# Patient Record
Sex: Male | Born: 1990 | Race: White | Hispanic: No | Marital: Single | Smoking: Current every day smoker
Health system: Southern US, Community
[De-identification: ages and names within clinical notes are randomized; demographics above are authoritative.]

## PROBLEM LIST (undated history)

## (undated) ENCOUNTER — Emergency Department (HOSPITAL_COMMUNITY): Disposition: A | Discharge status: Home / Self Care | Payer: Self-pay

## (undated) DIAGNOSIS — F319 Bipolar disorder, unspecified: Secondary | ICD-10-CM

## (undated) DIAGNOSIS — F419 Anxiety disorder, unspecified: Secondary | ICD-10-CM

---

## 2012-11-16 ENCOUNTER — Emergency Department (HOSPITAL_COMMUNITY)
Admission: EM | Admit: 2012-11-16 | Discharge: 2012-11-16 | Discharge status: Against Medical Advice | Payer: Self-pay | Attending: Emergency Medicine | Admitting: Emergency Medicine

## 2012-11-16 ENCOUNTER — Emergency Department (HOSPITAL_COMMUNITY): Payer: Self-pay

## 2012-11-16 DIAGNOSIS — S4980XA Other specified injuries of shoulder and upper arm, unspecified arm, initial encounter: Secondary | ICD-10-CM | POA: Insufficient documentation

## 2012-11-16 DIAGNOSIS — R209 Unspecified disturbances of skin sensation: Secondary | ICD-10-CM | POA: Insufficient documentation

## 2012-11-16 DIAGNOSIS — S46909A Unspecified injury of unspecified muscle, fascia and tendon at shoulder and upper arm level, unspecified arm, initial encounter: Secondary | ICD-10-CM | POA: Insufficient documentation

## 2012-11-16 DIAGNOSIS — Y9389 Activity, other specified: Secondary | ICD-10-CM | POA: Insufficient documentation

## 2012-11-16 DIAGNOSIS — S0993XA Unspecified injury of face, initial encounter: Secondary | ICD-10-CM | POA: Insufficient documentation

## 2012-11-16 DIAGNOSIS — Y9289 Other specified places as the place of occurrence of the external cause: Secondary | ICD-10-CM | POA: Insufficient documentation

## 2012-11-16 DIAGNOSIS — W1809XA Striking against other object with subsequent fall, initial encounter: Secondary | ICD-10-CM | POA: Insufficient documentation

## 2012-11-16 DIAGNOSIS — W19XXXA Unspecified fall, initial encounter: Secondary | ICD-10-CM

## 2012-11-16 DIAGNOSIS — S060X1A Concussion with loss of consciousness of 30 minutes or less, initial encounter: Secondary | ICD-10-CM | POA: Insufficient documentation

## 2012-11-16 DIAGNOSIS — F411 Generalized anxiety disorder: Secondary | ICD-10-CM | POA: Insufficient documentation

## 2012-11-16 DIAGNOSIS — W010XXA Fall on same level from slipping, tripping and stumbling without subsequent striking against object, initial encounter: Secondary | ICD-10-CM | POA: Insufficient documentation

## 2012-11-16 NOTE — ED Notes (Addendum)
Called for triage x2 no answer

## 2012-11-16 NOTE — ED Notes (Signed)
Per EMS: Pt fell from steps of camper, striking back of head. Lost conscious for 5 sec. Pt reporting head,back, neck, right upper knee and arm pain. Moves all extremities. Soft tissue mass noted to right upper arm. PT AO x4. Neuro intact. 108/78, 54 SR, 100 RA. 4 mg Zofran given en route. NKA.

## 2012-11-16 NOTE — ED Provider Notes (Signed)
History     CSN: [id number]  Arrival date & time 11/16/12  9528   First MD Initiated Contact with Patient 11/16/12 1918      Chief Complaint  Patient presents with  . Fall    (Consider location/radiation/quality/duration/timing/severity/associated sxs/prior treatment) HPI Comments: 22 year old male presents emergency department complaining of neck pain, head pain, and right arm and upper leg pain after falling out of his camper prior to arrival. States he was standing in the doorway when the rug slipped from underneath him and he fell backwards causing him to hit the back of his head. Admits to loss of consciousness for about 3 seconds. Denies any confusion and remembers everything that happened. Describes his pain everywhere as 10 out of 10. He states he has a mass in the upper part of his arm. Admits to tingling in his right fingers. States he is very anxious at this time.  Patient is a 22 y.o. male presenting with fall. The history is provided by the patient.  Fall Associated symptoms include headaches. Pertinent negatives include no abdominal pain, no nausea and no vomiting.    No past medical history on file.  No past surgical history on file.  No family history on file.  History  Substance Use Topics  . Smoking status: Not on file  . Smokeless tobacco: Not on file  . Alcohol Use: Not on file      Review of Systems  HENT: Positive for neck pain.   Eyes: Negative for visual disturbance.  Respiratory: Negative for shortness of breath.   Cardiovascular: Negative for chest pain.  Gastrointestinal: Negative for nausea, vomiting and abdominal pain.  Musculoskeletal: Positive for arthralgias (right shoulder and leg pain). Negative for back pain.  Neurological: Positive for headaches. Negative for dizziness and speech difficulty.  Psychiatric/Behavioral: Negative for confusion.  All other systems reviewed and are negative.    Allergies  Review of patient's allergies  indicates not on file.  Home Medications  No current outpatient prescriptions on file.  BP 118/54  Pulse 56  Temp(Src) 99 F (37.2 C) (Oral)  Resp 11  SpO2 100%  Physical Exam  Nursing note and vitals reviewed. Constitutional: He is oriented to person, place, and time. He appears well-developed and well-nourished. No distress. Cervical collar in place.  HENT:  Head: Normocephalic. Head is without raccoon's eyes, without Battle's sign, without abrasion, without contusion and without laceration.    Right Ear: No hemotympanum.  Left Ear: No hemotympanum.  Mouth/Throat: Uvula is midline, oropharynx is clear and moist and mucous membranes are normal.  Eyes: Conjunctivae and EOM are normal. Pupils are equal, round, and reactive to light.  Neck: Spinous process tenderness and muscular tenderness present.  Cardiovascular: Normal rate, regular rhythm, normal heart sounds and intact distal pulses.   Pulmonary/Chest: Effort normal and breath sounds normal. No respiratory distress.  Abdominal: Soft. Bowel sounds are normal. There is no tenderness.  Musculoskeletal:       Right shoulder: He exhibits decreased range of motion (screams when trying to perform passive ROM) and tenderness (generalized). He exhibits no swelling, no deformity and normal pulse.       Right elbow: Normal.      Right wrist: Normal.       Right hip: He exhibits normal range of motion and no laceration.       Thoracic back: Normal.       Lumbar back: Normal.       Arms:      Legs:  Neurological: He is alert and oriented to person, place, and time. No cranial nerve deficit or sensory deficit. GCS eye subscore is 4. GCS verbal subscore is 5. GCS motor subscore is 6.  Skin: Skin is warm, dry and intact. No bruising and no ecchymosis noted.  Psychiatric: His speech is normal. His mood appears anxious. He is agitated and slowed. Cognition and memory are normal. He expresses impulsivity.    ED Course  Procedures (including  critical care time)  Labs Reviewed - No data to display Dg Shoulder Right  11/16/2012  *RADIOLOGY REPORT*  Clinical Data: Right shoulder injury and pain.  RIGHT SHOULDER - 2+ VIEW  Comparison: None  Findings: There is no evidence of acute bony abnormality. There is no evidence of acute fracture, subluxation, or dislocation. No focal bony lesions are identified. The visualized right hemithorax is unremarkable.  IMPRESSION: No evidence of acute bony abnormality.   Original Report Authenticated By: Harmon Pier, M.D.    Dg Hip Complete Right  11/16/2012  *RADIOLOGY REPORT*  Clinical Data: Fall with right hip pain.  RIGHT HIP - COMPLETE 2+ VIEW  Comparison: None  Findings: No evidence of acute fracture, subluxation or dislocation identified.  No radio-opaque foreign bodies are present.  No focal bony lesions are noted.  The joint spaces are unremarkable.  IMPRESSION: No evidence of acute bony abnormality.   Original Report Authenticated By: Harmon Pier, M.D.      1. Fall       MDM  Patient left AMA while waiting for imaging to be complete. He pulled his IV out of his arm, pulled off the c-collar, stood up and was moving around the room without difficulty. He was moving both of his arms and walking without any problem. After pulling his IP at his arm, he began to lick the blood coming from the IV insertion. Patient AAOx3 with GCS of 15. States he needed his Klonopin, got up and walked out of the ED.        Trevor Mace, PA-C 11/16/12 2034

## 2012-11-16 NOTE — ED Provider Notes (Signed)
Medical screening examination/treatment/procedure(s) were performed by non-physician practitioner and as supervising physician I was immediately available for consultation/collaboration.  Lilliam Chamblee K Linker, MD 11/16/12 2036 

## 2012-11-16 NOTE — ED Notes (Signed)
LSB removed with c-spine precautions- tenderness noted in lower back as well as neck, c-collar remains in place.  Pt also having right upper arm pain as well as right knee pain - hx of surgery on right knee.  IV in place.

## 2012-11-16 NOTE — ED Notes (Addendum)
Arrived in pt's room to find pt yelling profanities stating "This place is incompetent. Everyone is pushing me around. I am leaving this place and I am going to get you all fired." Pt ripped out own IV. Catheter intact. Pt then started licking his blood off his arm. This RN convinced pt to wash hands and help bandage IV site. Pt able to move all extremities and arms with no difficulty noted. Attempted to convince pt to stay to continue evaluation. Pt continued to use profanity. Pt ambulated independently with no difficulty to waiting area with [employer] escort and family.

## 2012-11-16 NOTE — ED Notes (Signed)
Called for triage no answer  

## 2013-06-03 ENCOUNTER — Emergency Department (HOSPITAL_COMMUNITY)
Admission: EM | Admit: 2013-06-03 | Discharge: 2013-06-03 | Disposition: A | Discharge status: Home / Self Care | Payer: Self-pay | Attending: Emergency Medicine | Admitting: Emergency Medicine

## 2013-06-03 ENCOUNTER — Encounter (HOSPITAL_COMMUNITY): Payer: Self-pay | Admitting: Emergency Medicine

## 2013-06-03 ENCOUNTER — Emergency Department (HOSPITAL_COMMUNITY): Payer: Self-pay

## 2013-06-03 DIAGNOSIS — Z880 Allergy status to penicillin: Secondary | ICD-10-CM | POA: Insufficient documentation

## 2013-06-03 DIAGNOSIS — F17200 Nicotine dependence, unspecified, uncomplicated: Secondary | ICD-10-CM | POA: Insufficient documentation

## 2013-06-03 DIAGNOSIS — K429 Umbilical hernia without obstruction or gangrene: Secondary | ICD-10-CM | POA: Insufficient documentation

## 2013-06-03 LAB — CBC WITH DIFFERENTIAL/PLATELET
HCT: 47 % (ref 39.0–52.0)
Hemoglobin: 17 g/dL (ref 13.0–17.0)
Lymphocytes Relative: 34 % (ref 12–46)
Lymphs Abs: 3.4 10*3/uL (ref 0.7–4.0)
Monocytes Absolute: 0.7 10*3/uL (ref 0.1–1.0)
Monocytes Relative: 7 % (ref 3–12)
Neutro Abs: 5.9 10*3/uL (ref 1.7–7.7)
WBC: 10.3 10*3/uL (ref 4.0–10.5)

## 2013-06-03 LAB — URINALYSIS, ROUTINE W REFLEX MICROSCOPIC
Bilirubin Urine: NEGATIVE
Glucose, UA: NEGATIVE mg/dL
Hgb urine dipstick: NEGATIVE
Nitrite: NEGATIVE
Specific Gravity, Urine: 1.002 — ABNORMAL LOW (ref 1.005–1.030)
pH: 6 (ref 5.0–8.0)

## 2013-06-03 LAB — COMPREHENSIVE METABOLIC PANEL
Albumin: 4.4 g/dL (ref 3.5–5.2)
BUN: 12 mg/dL (ref 6–23)
Calcium: 9.6 mg/dL (ref 8.4–10.5)
GFR calc Af Amer: >90 mL/min (ref >90)
Glucose, Bld: 94 mg/dL (ref 70–99)
Sodium: 138 mEq/L (ref 135–145)
Total Protein: 7.7 g/dL (ref 6.0–8.3)

## 2013-06-03 MED ORDER — MORPHINE SULFATE 4 MG/ML IJ SOLN
4.0000 mg | Freq: Once | INTRAMUSCULAR | Status: AC
  Administered 2013-06-03: 4 mg via INTRAVENOUS
  Filled 2013-06-03: qty 1

## 2013-06-03 MED ORDER — HYDROCODONE-ACETAMINOPHEN 5-325 MG PO TABS: 1.0000 | ORAL_TABLET | Freq: Four times a day (QID) | ORAL | Status: DC | PRN

## 2013-06-03 MED ORDER — IOHEXOL 300 MG/ML  SOLN
100.0000 mL | Freq: Once | INTRAMUSCULAR | Status: AC | PRN
  Administered 2013-06-03: 100 mL via INTRAVENOUS

## 2013-06-03 NOTE — ED Provider Notes (Signed)
CSN: [id number]     Arrival date & time 06/03/13  1808 History   First MD Initiated Contact with Patient 06/03/13 1933     Chief Complaint  Patient presents with  . Abdominal Pain   (Consider location/radiation/quality/duration/timing/severity/associated sxs/prior Treatment) Patient is a 22 y.o. male presenting with abdominal pain. The history is provided by the patient.  Abdominal Pain Pain location:  Periumbilical Pain quality: sharp   Pain radiates to:  Suprapubic region Pain severity:  Moderate Onset quality:  Gradual Timing:  Constant Progression:  Worsening Chronicity:  New Context: not eating, not recent travel, not sick contacts, not suspicious food intake and not trauma   Relieved by:  Nothing Worsened by:  Nothing tried Ineffective treatments:  NSAIDs Associated symptoms: no chills, no cough, no diarrhea, no fever, no shortness of breath and no vomiting     History reviewed. No pertinent past medical history. History reviewed. No pertinent past surgical history. History reviewed. No pertinent family history. History  Substance Use Topics  . Smoking status: Current Every Day Smoker -- 0.50 packs/day    Types: Cigarettes  . Smokeless tobacco: Not on file  . Alcohol Use: No    Review of Systems  Constitutional: Negative for fever and chills.  Respiratory: Negative for cough and shortness of breath.   Gastrointestinal: Positive for abdominal pain. Negative for vomiting and diarrhea.  All other systems reviewed and are negative.    Allergies  Penicillins  Home Medications   Current Outpatient Rx  Name  Route  Sig  Dispense  Refill  . ibuprofen (ADVIL,MOTRIN) 200 MG tablet   Oral   Take 600 mg by mouth every 6 (six) hours as needed for pain.          BP 119/66  Pulse 103  Temp(Src) 97.9 F (36.6 C) (Oral)  Resp 12  Ht 5\' 5"  (1.651 m)  Wt 155 lb (70.308 kg)  BMI 25.79 kg/m2  SpO2 98% Physical Exam  Nursing note and vitals  reviewed. Constitutional: He is oriented to person, place, and time. He appears well-developed and well-nourished. No distress.  HENT:  Head: Normocephalic and atraumatic.  Mouth/Throat: No oropharyngeal exudate.  Eyes: EOM are normal. Pupils are equal, round, and reactive to light.  Neck: Normal range of motion. Neck supple.  Cardiovascular: Normal rate and regular rhythm.  Exam reveals no friction rub.   No murmur heard. Pulmonary/Chest: Effort normal and breath sounds normal. No respiratory distress. He has no wheezes. He has no rales.  Abdominal: He exhibits no distension and no mass (no hernia present). There is tenderness (umbilical). There is no rebound and no guarding.  Musculoskeletal: Normal range of motion. He exhibits no edema.  Neurological: He is alert and oriented to person, place, and time.  Skin: He is not diaphoretic.    ED Course  Procedures (including critical care time) Labs Review Labs Reviewed  CBC WITH DIFFERENTIAL - Abnormal; Notable for the following:    MCHC 36.2 (*)    All other components within normal limits  URINALYSIS, ROUTINE W REFLEX MICROSCOPIC - Abnormal; Notable for the following:    APPearance CLOUDY (*)    Specific Gravity, Urine 1.002 (*)    All other components within normal limits  COMPREHENSIVE METABOLIC PANEL  LIPASE, BLOOD   Imaging Review Ct Abdomen Pelvis W Contrast  06/03/2013   *RADIOLOGY REPORT*  Clinical Data: Abdominal pain.  CT ABDOMEN AND PELVIS WITH CONTRAST  Technique:  Multidetector CT imaging of the abdomen and pelvis  was performed following the standard protocol during bolus administration of intravenous contrast.  Contrast: OMNIPAQUE IOHEXOL 300 MG/ML  SOLN intravenously.  Comparison: CT scan of March 16, 2013.  Findings: Visualized lung bases appear normal.  The liver, spleen and pancreas appear normal.  No gallstones are noted.  Adrenal glands and kidneys appear normal.  No hydronephrosis or renal obstruction is noted.   The appendix appears normal.  Small periumbilical fat-containing hernia is noted and unchanged compared to prior exam.  No evidence of bowel obstruction is noted.  No abnormal fluid collection is noted.  Urinary bladder appears normal.  No significant adenopathy is noted.  IMPRESSION: Small fat containing periumbilical hernia is noted which is unchanged compared to prior exam.  No other abnormality is seen in the abdomen or pelvis.   Original Report Authenticated By: Lupita Raider.,  M.D.    MDM   1. Umbilical hernia     55M presents with concerns for umbilical hernia. Stated it bulged out yesterday and he put it back in. Today it is bulging out and severely tender. Patient here with stable vitals, denying N/V/D, GU symptoms. Pain sometimes radiates to suprapubic region, but mostly localized at umbilicus.  On exam, exquisite tenderness at umbilicus. No hernia appreciated. Patient is skinny, with deep palpation no hernias felt, however it is extremely painful to patient. Will CT scan to look for other causes of abdominal pain. Patient does not have an umbilical hernia containing bowel. CT scan with fat filled umbilical hernia, otherwise normal. Patient given small amount of vicodin. Stable for discharge.   I have reviewed all labs and imaging and considered them in my medical decision making.   Dagmar Hait, MD 06/03/13 2253

## 2013-06-03 NOTE — ED Notes (Addendum)
Pt reports bump came up on stomach about a week ago, reports was able to push back into stomach and would stay but then would pop back out; now having abd pain starting this AM; denies n/v; pt appears in severe pain at triage, pt guarding; bump appears to be umbilical hernia

## 2013-06-03 NOTE — ED Notes (Signed)
Pt states that one week ago his belly button was swollen and he could push it back in and it did not hurt. He states that it started hurting yesterday. He states that now when he pushes on the belly button it makes him nauseous.

## 2013-09-19 ENCOUNTER — Emergency Department (HOSPITAL_COMMUNITY): Payer: Self-pay

## 2013-09-19 ENCOUNTER — Emergency Department (HOSPITAL_COMMUNITY)
Admission: EM | Admit: 2013-09-19 | Discharge: 2013-09-19 | Disposition: A | Discharge status: Home / Self Care | Payer: Self-pay | Attending: Emergency Medicine | Admitting: Emergency Medicine

## 2013-09-19 ENCOUNTER — Encounter (HOSPITAL_COMMUNITY): Payer: Self-pay | Admitting: Emergency Medicine

## 2013-09-19 DIAGNOSIS — F319 Bipolar disorder, unspecified: Secondary | ICD-10-CM | POA: Insufficient documentation

## 2013-09-19 DIAGNOSIS — Z880 Allergy status to penicillin: Secondary | ICD-10-CM | POA: Insufficient documentation

## 2013-09-19 DIAGNOSIS — Z79899 Other long term (current) drug therapy: Secondary | ICD-10-CM | POA: Insufficient documentation

## 2013-09-19 DIAGNOSIS — F17200 Nicotine dependence, unspecified, uncomplicated: Secondary | ICD-10-CM | POA: Insufficient documentation

## 2013-09-19 DIAGNOSIS — W108XXA Fall (on) (from) other stairs and steps, initial encounter: Secondary | ICD-10-CM | POA: Insufficient documentation

## 2013-09-19 DIAGNOSIS — F411 Generalized anxiety disorder: Secondary | ICD-10-CM | POA: Insufficient documentation

## 2013-09-19 DIAGNOSIS — R109 Unspecified abdominal pain: Secondary | ICD-10-CM

## 2013-09-19 DIAGNOSIS — Y929 Unspecified place or not applicable: Secondary | ICD-10-CM | POA: Insufficient documentation

## 2013-09-19 DIAGNOSIS — S3981XA Other specified injuries of abdomen, initial encounter: Secondary | ICD-10-CM | POA: Insufficient documentation

## 2013-09-19 DIAGNOSIS — Y9389 Activity, other specified: Secondary | ICD-10-CM | POA: Insufficient documentation

## 2013-09-19 DIAGNOSIS — W208XXA Other cause of strike by thrown, projected or falling object, initial encounter: Secondary | ICD-10-CM | POA: Insufficient documentation

## 2013-09-19 HISTORY — DX: Bipolar disorder, unspecified: F31.9

## 2013-09-19 HISTORY — DX: Anxiety disorder, unspecified: F41.9

## 2013-09-19 LAB — RAPID URINE DRUG SCREEN, HOSP PERFORMED
Amphetamines: NOT DETECTED
Benzodiazepines: NOT DETECTED
Cocaine: NOT DETECTED
Opiates: NOT DETECTED
Tetrahydrocannabinol: POSITIVE — AB

## 2013-09-19 LAB — BASIC METABOLIC PANEL
BUN: 14 mg/dL (ref 6–23)
GFR calc non Af Amer: >90 mL/min (ref >90)
Glucose, Bld: 112 mg/dL — ABNORMAL HIGH (ref 70–99)
Potassium: 3.7 mEq/L (ref 3.5–5.1)

## 2013-09-19 LAB — URINALYSIS, ROUTINE W REFLEX MICROSCOPIC
Bilirubin Urine: NEGATIVE
Hgb urine dipstick: NEGATIVE
Specific Gravity, Urine: 1.007 (ref 1.005–1.030)
Urobilinogen, UA: 0.2 mg/dL (ref 0.0–1.0)

## 2013-09-19 MED ORDER — IOHEXOL 300 MG/ML  SOLN
100.0000 mL | Freq: Once | INTRAMUSCULAR | Status: AC | PRN
  Administered 2013-09-19: 100 mL via INTRAVENOUS

## 2013-09-19 MED ORDER — IOHEXOL 300 MG/ML  SOLN
50.0000 mL | Freq: Once | INTRAMUSCULAR | Status: AC | PRN
  Administered 2013-09-19: 50 mL via ORAL

## 2013-09-19 NOTE — ED Notes (Addendum)
Per [facility], Pt c/o R side abdominal pain and distention.  Pain score 10/10.  Pt reports he was helping move a couch up some stairs, the couch slid backward, and landed on his abdomen.  Pt sts he has been taking ibuprofen w/o relief.

## 2013-09-19 NOTE — ED Provider Notes (Signed)
CSN: [id number]     Arrival date & time 09/19/13  1607 History   First MD Initiated Contact with Patient 09/19/13 1815     Chief Complaint  Patient presents with  . Abdominal Pain   (Consider location/radiation/quality/duration/timing/severity/associated sxs/prior Treatment) Patient is a 22 y.o. male presenting with abdominal pain. The history is provided by the patient. No language interpreter was used.  Abdominal Pain Pain location:  RUQ Pain radiates to:  Does not radiate Pain severity:  Mild Context: not sick contacts   Associated symptoms: no chest pain, no chills, no diarrhea, no dysuria, no fatigue, no hematuria, no nausea, no shortness of breath and no vomiting    Pt is a 22 year old male who presents with c/o abdominal pain for the last three days. He reports that he was helping someone carry a sectional sofa, he lost his footing and fell down the steps with the couch falling on top of him. He denies nausea, vomiting or diarrhea. No difficulty urinating or hematuria. He reports that he is having normal bm's.   Past Medical History  Diagnosis Date  . Bipolar 1 disorder   . Anxiety    History reviewed. No pertinent past surgical history. History reviewed. No pertinent family history. History  Substance Use Topics  . Smoking status: Current Every Day Smoker -- 0.50 packs/day    Types: Cigarettes  . Smokeless tobacco: Never Used  . Alcohol Use: No    Review of Systems  Constitutional: Negative for chills and fatigue.  Respiratory: Negative for shortness of breath.   Cardiovascular: Negative for chest pain.  Gastrointestinal: Positive for abdominal pain. Negative for nausea, vomiting, diarrhea and blood in stool.  Genitourinary: Negative for dysuria and hematuria.  All other systems reviewed and are negative.    Allergies  Penicillins  Home Medications   Current Outpatient Rx  Name  Route  Sig  Dispense  Refill  . busPIRone (BUSPAR) 10 MG tablet   Oral   Take  10 mg by mouth 2 (two) times daily.          . clonazePAM (KLONOPIN) 0.5 MG tablet   Oral   Take 0.5 mg by mouth 2 (two) times daily as needed for anxiety.         . divalproex (DEPAKOTE ER) 500 MG 24 hr tablet   Oral   Take 500 mg by mouth 2 (two) times daily.          Marland Kitchen ibuprofen (ADVIL,MOTRIN) 200 MG tablet   Oral   Take 600 mg by mouth every 6 (six) hours as needed for pain.         Marland Kitchen QUEtiapine Fumarate (SEROQUEL XR) 150 MG 24 hr tablet   Oral   Take 150 mg by mouth 2 (two) times daily.          BP 111/70  Pulse 96  Temp(Src) 98 F (36.7 C) (Oral)  Resp 20  SpO2 98% Physical Exam  Nursing note and vitals reviewed. Constitutional: He is oriented to person, place, and time. He appears well-developed and well-nourished. No distress.  HENT:  Head: Normocephalic and atraumatic.  Eyes: Conjunctivae are normal.  Neck: Normal range of motion. Neck supple. No JVD present. No tracheal deviation present. No thyromegaly present.  Cardiovascular: Normal rate, regular rhythm, normal heart sounds and intact distal pulses.   Pulmonary/Chest: Effort normal and breath sounds normal.  Abdominal: Soft. Bowel sounds are normal. He exhibits no distension. There is tenderness.  Musculoskeletal: Normal range  of motion.  Neurological: He is alert and oriented to person, place, and time.  Skin: Skin is warm and dry.  Psychiatric: He has a normal mood and affect. His behavior is normal. Judgment and thought content normal.    ED Course  Procedures (including critical care time) Labs Review Labs Reviewed - No data to display Imaging Review Dg Abd Acute W/chest  09/19/2013   CLINICAL DATA:  Abdominal pain  EXAM: ACUTE ABDOMEN SERIES (ABDOMEN 2 VIEW & CHEST 1 VIEW)  COMPARISON:  None.  FINDINGS: There is no evidence of dilated bowel loops or free intraperitoneal air. No radiopaque calculi or other significant radiographic abnormality is seen. Heart size and mediastinal contours are  within normal limits. Both lungs are clear. Moderate-to-large amount of stool is appreciated within the colon.  IMPRESSION: Negative abdominal radiographs. Moderate to large amount of fecal retention. No acute cardiopulmonary disease.   Electronically Signed   By: Salome Holmes M.D.   On: 09/19/2013 17:52    EKG Interpretation   None       MDM   1. Abdominal pain     Negative abd/pelvis CT; no acute process seen in abdomen or pelvis. Abdominal exam reassuring. UDS; + for THC. Urinalysis clear. Pt stable for discharge. Return precautions given. Pt understands plan of care.     Irish Elders, NP 09/26/13 [phone]

## 2013-09-28 NOTE — ED Provider Notes (Signed)
Medical screening examination/treatment/procedure(s) were performed by non-physician practitioner and as supervising physician I was immediately available for consultation/collaboration.    Celene KrasJon R Marky Buresh, MD 09/28/13 [phone]

## 2013-10-06 ENCOUNTER — Emergency Department (HOSPITAL_COMMUNITY): Payer: Self-pay

## 2013-10-06 ENCOUNTER — Inpatient Hospital Stay (HOSPITAL_COMMUNITY)
Admission: EM | Admit: 2013-10-06 | Discharge: 2013-10-09 | DRG: 918 | Disposition: A | Discharge status: Home / Self Care | Payer: Self-pay | Attending: Internal Medicine | Admitting: Internal Medicine

## 2013-10-06 ENCOUNTER — Encounter (HOSPITAL_COMMUNITY): Payer: Self-pay | Admitting: Emergency Medicine

## 2013-10-06 ENCOUNTER — Other Ambulatory Visit: Payer: Self-pay

## 2013-10-06 DIAGNOSIS — T401X4A Poisoning by heroin, undetermined, initial encounter: Principal | ICD-10-CM | POA: Diagnosis present

## 2013-10-06 DIAGNOSIS — D72829 Elevated white blood cell count, unspecified: Secondary | ICD-10-CM | POA: Diagnosis present

## 2013-10-06 DIAGNOSIS — Z880 Allergy status to penicillin: Secondary | ICD-10-CM

## 2013-10-06 DIAGNOSIS — B9789 Other viral agents as the cause of diseases classified elsewhere: Secondary | ICD-10-CM | POA: Diagnosis present

## 2013-10-06 DIAGNOSIS — R4182 Altered mental status, unspecified: Secondary | ICD-10-CM | POA: Diagnosis present

## 2013-10-06 DIAGNOSIS — R5081 Fever presenting with conditions classified elsewhere: Secondary | ICD-10-CM | POA: Diagnosis present

## 2013-10-06 DIAGNOSIS — F17200 Nicotine dependence, unspecified, uncomplicated: Secondary | ICD-10-CM | POA: Diagnosis present

## 2013-10-06 DIAGNOSIS — F1120 Opioid dependence, uncomplicated: Secondary | ICD-10-CM | POA: Diagnosis present

## 2013-10-06 DIAGNOSIS — Z79899 Other long term (current) drug therapy: Secondary | ICD-10-CM

## 2013-10-06 DIAGNOSIS — F411 Generalized anxiety disorder: Secondary | ICD-10-CM | POA: Diagnosis present

## 2013-10-06 DIAGNOSIS — T40601A Poisoning by unspecified narcotics, accidental (unintentional), initial encounter: Secondary | ICD-10-CM | POA: Diagnosis present

## 2013-10-06 DIAGNOSIS — T400X1A Poisoning by opium, accidental (unintentional), initial encounter: Secondary | ICD-10-CM | POA: Diagnosis present

## 2013-10-06 DIAGNOSIS — F319 Bipolar disorder, unspecified: Secondary | ICD-10-CM | POA: Diagnosis present

## 2013-10-06 DIAGNOSIS — F1210 Cannabis abuse, uncomplicated: Secondary | ICD-10-CM | POA: Diagnosis present

## 2013-10-06 DIAGNOSIS — T401X1A Poisoning by heroin, accidental (unintentional), initial encounter: Secondary | ICD-10-CM | POA: Diagnosis present

## 2013-10-06 DIAGNOSIS — F19939 Other psychoactive substance use, unspecified with withdrawal, unspecified: Secondary | ICD-10-CM | POA: Diagnosis not present

## 2013-10-06 DIAGNOSIS — J069 Acute upper respiratory infection, unspecified: Secondary | ICD-10-CM | POA: Diagnosis present

## 2013-10-06 DIAGNOSIS — T50901A Poisoning by unspecified drugs, medicaments and biological substances, accidental (unintentional), initial encounter: Secondary | ICD-10-CM | POA: Diagnosis present

## 2013-10-06 LAB — CBC WITH DIFFERENTIAL/PLATELET
BASOS ABS: 0 10*3/uL (ref 0.0–0.1)
Basophils Relative: 0 % (ref 0–1)
EOS PCT: 1 % (ref 0–5)
Eosinophils Absolute: 0.1 10*3/uL (ref 0.0–0.7)
HCT: 44.2 % (ref 39.0–52.0)
Hemoglobin: 15.4 g/dL (ref 13.0–17.0)
LYMPHS PCT: 19 % (ref 12–46)
Lymphs Abs: 2.6 10*3/uL (ref 0.7–4.0)
MCH: 30.6 pg (ref 26.0–34.0)
MCHC: 34.8 g/dL (ref 30.0–36.0)
MCV: 87.7 fL (ref 78.0–100.0)
MONOS PCT: 6 % (ref 3–12)
Monocytes Absolute: 0.8 10*3/uL (ref 0.1–1.0)
Neutro Abs: 10.1 10*3/uL — ABNORMAL HIGH (ref 1.7–7.7)
Neutrophils Relative %: 74 % (ref 43–77)
PLATELETS: 198 10*3/uL (ref 150–400)
RBC: 5.04 MIL/uL (ref 4.22–5.81)
RDW: 13.4 % (ref 11.5–15.5)
WBC: 13.6 10*3/uL — AB (ref 4.0–10.5)

## 2013-10-06 LAB — BASIC METABOLIC PANEL
BUN: 13 mg/dL (ref 6–23)
CALCIUM: 9.1 mg/dL (ref 8.4–10.5)
CHLORIDE: 100 meq/L (ref 96–112)
CO2: 26 meq/L (ref 19–32)
Creatinine, Ser: 1.07 mg/dL (ref 0.50–1.35)
GFR calc Af Amer: >90 mL/min (ref >90)
GFR calc non Af Amer: >90 mL/min (ref >90)
GLUCOSE: 101 mg/dL — AB (ref 70–99)
POTASSIUM: 4 meq/L (ref 3.7–5.3)
SODIUM: 139 meq/L (ref 137–147)

## 2013-10-06 LAB — HEPATIC FUNCTION PANEL
ALBUMIN: 4 g/dL (ref 3.5–5.2)
ALT: 15 U/L (ref 0–53)
AST: 21 U/L (ref 0–37)
Alkaline Phosphatase: 66 U/L (ref 39–117)
Total Bilirubin: 0.5 mg/dL (ref 0.3–1.2)
Total Protein: 6.6 g/dL (ref 6.0–8.3)

## 2013-10-06 LAB — RAPID URINE DRUG SCREEN, HOSP PERFORMED
Amphetamines: NOT DETECTED
BARBITURATES: NOT DETECTED
BENZODIAZEPINES: NOT DETECTED
COCAINE: NOT DETECTED
Opiates: POSITIVE — AB
TETRAHYDROCANNABINOL: POSITIVE — AB

## 2013-10-06 LAB — ACETAMINOPHEN LEVEL: Acetaminophen (Tylenol), Serum: <15 ug/mL (ref 10–30)

## 2013-10-06 LAB — VALPROIC ACID LEVEL: Valproic Acid Lvl: <10 ug/mL — ABNORMAL LOW (ref 50.0–100.0)

## 2013-10-06 LAB — SALICYLATE LEVEL: Salicylate Lvl: <2 mg/dL — ABNORMAL LOW (ref 2.8–20.0)

## 2013-10-06 LAB — TROPONIN I: Troponin I: <0.3 ng/mL (ref <0.30)

## 2013-10-06 LAB — ETHANOL: Alcohol, Ethyl (B): <11 mg/dL (ref 0–11)

## 2013-10-06 MED ORDER — SODIUM CHLORIDE 0.9 % IV SOLN
INTRAVENOUS | Status: DC
  Administered 2013-10-06 – 2013-10-07 (×2): via INTRAVENOUS

## 2013-10-06 MED ORDER — SODIUM CHLORIDE 0.9 % IJ SOLN
3.0000 mL | Freq: Two times a day (BID) | INTRAMUSCULAR | Status: DC
  Administered 2013-10-06 – 2013-10-09 (×2): 3 mL via INTRAVENOUS

## 2013-10-06 MED ORDER — NALOXONE HCL 1 MG/ML IJ SOLN
0.5000 mg/h | INTRAVENOUS | Status: DC
  Administered 2013-10-06 – 2013-10-07 (×2): 0.5 mg/h via INTRAVENOUS
  Filled 2013-10-06 (×2): qty 4

## 2013-10-06 MED ORDER — GUAIFENESIN-DM 100-10 MG/5ML PO SYRP: 5.0000 mL | ORAL_SOLUTION | ORAL | Status: DC | PRN

## 2013-10-06 MED ORDER — IBUPROFEN 600 MG PO TABS
600.0000 mg | ORAL_TABLET | Freq: Four times a day (QID) | ORAL | Status: DC | PRN
  Administered 2013-10-06 – 2013-10-08 (×3): 600 mg via ORAL
  Filled 2013-10-06 (×4): qty 1

## 2013-10-06 MED ORDER — ONDANSETRON HCL 4 MG/2ML IJ SOLN
4.0000 mg | Freq: Four times a day (QID) | INTRAMUSCULAR | Status: DC | PRN
  Administered 2013-10-07 – 2013-10-08 (×3): 4 mg via INTRAVENOUS
  Filled 2013-10-06 (×3): qty 2

## 2013-10-06 MED ORDER — NALOXONE HCL 0.4 MG/ML IJ SOLN: 0.4000 mg | Freq: Once | INTRAMUSCULAR | Status: DC

## 2013-10-06 MED ORDER — LEVOFLOXACIN 750 MG PO TABS
750.0000 mg | ORAL_TABLET | Freq: Every day | ORAL | Status: DC
  Administered 2013-10-06: 750 mg via ORAL
  Filled 2013-10-06 (×2): qty 1

## 2013-10-06 MED ORDER — ONDANSETRON HCL 4 MG PO TABS: 4.0000 mg | ORAL_TABLET | Freq: Four times a day (QID) | ORAL | Status: DC | PRN

## 2013-10-06 MED ORDER — KETOROLAC TROMETHAMINE 30 MG/ML IJ SOLN
30.0000 mg | Freq: Once | INTRAMUSCULAR | Status: AC
  Administered 2013-10-06: 30 mg via INTRAVENOUS
  Filled 2013-10-06: qty 1

## 2013-10-06 MED ORDER — NALOXONE HCL 0.4 MG/ML IJ SOLN
0.4000 mg | Freq: Once | INTRAMUSCULAR | Status: AC
  Administered 2013-10-06: 0.4 mg via INTRAVENOUS

## 2013-10-06 MED ORDER — NALOXONE HCL 1 MG/ML IJ SOLN
INTRAMUSCULAR | Status: AC
  Administered 2013-10-06: 2 mg
  Filled 2013-10-06: qty 2

## 2013-10-06 MED ORDER — DIVALPROEX SODIUM ER 500 MG PO TB24
500.0000 mg | ORAL_TABLET | Freq: Two times a day (BID) | ORAL | Status: DC
  Administered 2013-10-06 – 2013-10-09 (×6): 500 mg via ORAL
  Filled 2013-10-06 (×8): qty 1

## 2013-10-06 MED ORDER — POLYETHYLENE GLYCOL 3350 17 G PO PACK
17.0000 g | PACK | Freq: Every day | ORAL | Status: DC | PRN
  Filled 2013-10-06: qty 1

## 2013-10-06 NOTE — ED Notes (Signed)
Pt was found by ems with a friend for possible overdose on heroin and 8 oxy pt was given 1mg  narcan IV pt awaken and pulled out iv, pt was uncon when found and no gag reflex. Pt is drowsy at this time and had a needle in his clothes when removing.

## 2013-10-06 NOTE — ED Provider Notes (Signed)
Medical screening examination/treatment/procedure(s) were conducted as a shared visit with non-physician practitioner(s) and myself.  I personally evaluated the patient during the encounter.  EKG Interpretation   None      Patient here after ingesting approximately 20 hydrocodone tablets which were 7.5 mg per 325 acetaminophen. Patient denies that this was a suicide attempt. He also use heroin. Patient arrived here after receiving Narcan per EMS and received an additional dose here. Will refer patient be medically cleared and then [facility]  Toy BakerAnthony T Sanii Kukla, MD 10/06/13 1843

## 2013-10-06 NOTE — ED Notes (Signed)
TONDA (Grandmother) [phone]

## 2013-10-06 NOTE — H&P (Signed)
[facility]                                                                                    Patient Demographics  Scott Weaver, is a 23 y.o. male  MRN: [id number]   DOB - 1991/02/13  Admit Date - 10/06/2013  Outpatient Primary MD for the patient is Default, Provider, MD   With History of -  Past Medical History  Diagnosis Date  . Bipolar 1 disorder   . Anxiety       History reviewed. No pertinent past surgical history.  in for   Chief Complaint  Patient presents with  . Drug Overdose     HPI  Scott Weaver  is a 23 y.o. male, with history of depression anxiety, who was brought in by EMS after he was found unresponsive by friends, creatinine by friends patient apparently took some recreational heroin and some oxycodone pills and then became unresponsive, received Narcan by EMS and then in the ER, was surgically placed on Narcan drip with good effect, he's currently alert awake minimally drowsy. He claims he has no memory of what happened before and says he might have indulged in some recreational drug activity with friends and did not want to hurt himself. Denies any suicidal thoughts or ideations, his only subjective complaint currently is an ongoing productive cough for the last few days, some chest discomfort after prolonged bouts of cough. Urine drug screen in the ER positive for opioids and marijuana, chest x-ray unremarkable, mild leukocytosis, unremarkable salicylate and acetaminophen levels.  Denies any headache, no focal deficits, no palpitations or chest pain besides when he is coughing which he gets some generalized chest discomfort in his rib cage, no abdominal pain, no diarrhea, no blood in stool or urine, no dysuria. She agrees to regular smoking but denies regular recreational drug use or prescription drug abuse.   Review of Systems    In addition to the HPI above,   No Fever-chills, No Headache, No changes with Vision or hearing, No problems  swallowing food or Liquids, No Chest pain, positive cough but no  Shortness of Breath, No Abdominal pain, No Nausea or Vommitting, Bowel movements are regular, No Blood in stool or Urine, No dysuria, No new skin rashes or bruises, No new joints pains-aches,  No new weakness, tingling, numbness in any extremity, No recent weight gain or loss, No polyuria, polydypsia or polyphagia, No significant Mental Stressors.  A full 10 point Review of Systems was done, except as stated above, all other Review of Systems were negative.   Social History History  Substance Use Topics  . Smoking status: Current Every Day Smoker -- 0.50 packs/day    Types: Cigarettes  . Smokeless tobacco: Never Used  . Alcohol Use: No      Family History Depression in his mother  Prior to Admission medications   Medication Sig Start Date End Date Taking? Authorizing Provider  busPIRone (BUSPAR) 10 MG tablet Take 10 mg by mouth 2 (two) times daily.    Yes Historical Provider, MD  divalproex (DEPAKOTE ER) 500 MG 24 hr tablet Take 500 mg by mouth 2 (two) times daily.  Yes Historical Provider, MD  QUEtiapine Fumarate (SEROQUEL XR) 150 MG 24 hr tablet Take 150 mg by mouth 2 (two) times daily.   Yes Historical Provider, MD  naloxone [facility]) 1 MG/ML injection Inject 1 mg into the vein once.    Historical Provider, MD    Allergies  Allergen Reactions  . Tramadol     "throat swells up and lymph nodes"  . Penicillins Hives    Physical Exam  Vitals  Blood pressure 122/69, pulse 79, temperature 98 F (36.7 C), temperature source Oral, resp. rate 13, SpO2 96.00%.   1. General Young white male lying in bed in mildly drowsy but answering all questions appropriately and following appropriate commands  2. Normal affect and insight, Not Suicidal or Homicidal, drowsy but staying awake, Oriented X 3.  3. No F.N deficits, ALL C.Nerves Intact, Strength 5/5 all 4 extremities, Sensation intact all 4 extremities,  Plantars down going.  4. Ears and Eyes appear Normal, Conjunctivae clear, PERRLA. Moist Oral Mucosa.  5. Supple Neck, No JVD, No cervical lymphadenopathy appriciated, No Carotid Bruits.  6. Symmetrical Chest wall movement, Good air movement bilaterally, CTAB.  7. RRR, No Gallops, Rubs or Murmurs, No Parasternal Heave.  8. Positive Bowel Sounds, Abdomen Soft, Non tender, No organomegaly appriciated,No rebound -guarding or rigidity.  9.  No Cyanosis, Normal Skin Turgor, No Skin Rash or Bruise. I do not know stress any track marks in his arms  10. Good muscle tone,  joints appear normal , no effusions, Normal ROM.  11. No Palpable Lymph Nodes in Neck or Axillae     Data Review  CBC  Recent Labs Lab 10/06/13 1642  WBC 13.6*  HGB 15.4  HCT 44.2  PLT 198  MCV 87.7  MCH 30.6  MCHC 34.8  RDW 13.4  LYMPHSABS 2.6  MONOABS 0.8  EOSABS 0.1  BASOSABS 0.0   ------------------------------------------------------------------------------------------------------------------  Chemistries   Recent Labs Lab 10/06/13 1642  NA 139  K 4.0  CL 100  CO2 26  GLUCOSE 101*  BUN 13  CREATININE 1.07  CALCIUM 9.1    Results for Scott Weaver, Scott Weaver (MRN [id number]) as of 10/06/2013 21:30  Ref. Range 10/06/2013 19:05 10/06/2013 20:32  Alcohol, Ethyl (B) Latest Range: 0-11 mg/dL <14   Amphetamines Latest Range: NONE DETECTED   NONE DETECTED  Barbiturates Latest Range: NONE DETECTED   NONE DETECTED  Benzodiazepines Latest Range: NONE DETECTED   NONE DETECTED  Opiates Latest Range: NONE DETECTED   POSITIVE (A)  COCAINE Latest Range: NONE DETECTED   NONE DETECTED  Tetrahydrocannabinol Latest Range: NONE DETECTED   POSITIVE (A)    ------------------------------------------------------------------------------------------------------------------ CrCl is unknown because both a height and weight (above a minimum accepted value) are required for this  calculation. ------------------------------------------------------------------------------------------------------------------ No results found for this basename: TSH, T4TOTAL, FREET3, T3FREE, THYROIDAB,  in the last 72 hours   Coagulation profile No results found for this basename: INR, PROTIME,  in the last 168 hours ------------------------------------------------------------------------------------------------------------------- No results found for this basename: DDIMER,  in the last 72 hours -------------------------------------------------------------------------------------------------------------------  Cardiac Enzymes  Recent Labs Lab 10/06/13 1642  TROPONINI <0.30   ------------------------------------------------------------------------------------------------------------------ No components found with this basename: POCBNP,    ---------------------------------------------------------------------------------------------------------------  Urinalysis    Component Value Date/Time   COLORURINE YELLOW 09/19/2013 2231   APPEARANCEUR CLEAR 09/19/2013 2231   LABSPEC 1.007 09/19/2013 2231   PHURINE 7.0 09/19/2013 2231   GLUCOSEU NEGATIVE 09/19/2013 2231   HGBUR NEGATIVE 09/19/2013 2231   BILIRUBINUR NEGATIVE 09/19/2013 2231  KETONESUR NEGATIVE 09/19/2013 2231   PROTEINUR NEGATIVE 09/19/2013 2231   UROBILINOGEN 0.2 09/19/2013 2231   NITRITE NEGATIVE 09/19/2013 2231   LEUKOCYTESUR NEGATIVE 09/19/2013 2231    ----------------------------------------------------------------------------------------------------------------  Imaging results:      Dg Chest Portable 1 View  10/06/2013   CLINICAL DATA:  Drug overdose.  Tobacco use.  EXAM: PORTABLE CHEST - 1 VIEW  COMPARISON:  DG ABD ACUTE W/CHEST dated 09/19/2013; CT ABD/PELVIS W CM dated 09/19/2013  FINDINGS: The heart size and mediastinal contours are within normal limits. Both lungs are clear. The visualized skeletal  structures are unremarkable.  IMPRESSION: No active disease.   Electronically Signed   By: Herbie BaltimoreWalt  Liebkemann M.D.   On: 10/06/2013 17:12      My personal review of EKG: Rhythm NSR, Rate  99 /min,  no Acute ST changes    Assessment & Plan     1. Decreased mental status due to recreational heroin overdose and oxycodone overdose. Currently on Narcan drip and alert awake maintaining her weight, continue Narcan drip, initial acetaminophen level is stable, will check a liver function panel also. Discussed with poison control in detail if repeat testament-level in 4 hours is negative and liver function remains stable no further care except for heroin overdose which will be supportive with Narcan, aspiration precautions and evaluation by social work in the morning. Patient denies any suicidal ideations her thoughts, most likely was recreational overdose while he was indulging in some drugs with friends.   2. History of depression anxiety for now we'll continue his Depakote only. Psych consult in the morning if needed on outpatient depending on his condition tomorrow.    3. URI- Coarse breath sounds with a productive cough. Likely has bronchitis. Chest x-ray is clear he is afebrile, does have mild leukocytosis, will check sputum Gram stain culture, place him on Levaquin orally for now.   4. History of smoking and recreational marijuana use. Counseled to quit both.     DVT Prophylaxis   SCDs    AM Labs Ordered, also please review Full Orders  Family Communication: Admission, patients condition and plan of care including tests being ordered have been discussed with the patient  who indicates understanding and agree with the plan and Code Status.  Code Status full  Likely DC to  home  Condition Fair  Time spent in minutes :      Leroy SeaSINGH,Aldwin Micalizzi K M.D on 10/06/2013 at 9:23 PM  Between 7am to 7pm - Pager - [phone]  After 7pm go to [email or web address] - password [email or web address]  And look for the  night coverage person covering me after hours  [facility] Office  [phone]

## 2013-10-06 NOTE — ED Notes (Addendum)
Error in clicking off i-stat troponin. Label sent down for main lab troponin.

## 2013-10-06 NOTE — ED Notes (Signed)
Bed: WA09 Expected date:  Expected time:  Means of arrival:  Comments: EMS-OD

## 2013-10-06 NOTE — ED Provider Notes (Signed)
CSN: [id number]     Arrival date & time 10/06/13  1602 History   First MD Initiated Contact with Patient 10/06/13 1632     Chief Complaint  Patient presents with  . Drug Overdose   (Consider location/radiation/quality/duration/timing/severity/associated sxs/prior Treatment) HPI Comments: Per EMS, patient was found by EMS with a friend.  Patient was found unconscious and with no gag reflex. They had been using heroin and oxycodone. Patient was given 1mg  narcan IV.  Patient states that he had been using drugs, but doesn't remember which.  He states that his chest hurts and his low back hurts.  Endorses associated nausea.  Patient is a 23 y.o. male presenting with Overdose. The history is provided by the patient. No language interpreter was used.  Drug Overdose This is a new problem. The current episode started today. The problem has been gradually improving. Associated symptoms include chest pain and nausea. Treatments tried: narcan. The treatment provided moderate relief.    Past Medical History  Diagnosis Date  . Bipolar 1 disorder   . Anxiety    No past surgical history on file. No family history on file. History  Substance Use Topics  . Smoking status: Current Every Day Smoker -- 0.50 packs/day    Types: Cigarettes  . Smokeless tobacco: Never Used  . Alcohol Use: No    Review of Systems  Unable to perform ROS: Mental status change  Cardiovascular: Positive for chest pain.  Gastrointestinal: Positive for nausea.    Allergies  Penicillins  Home Medications   Current Outpatient Rx  Name  Route  Sig  Dispense  Refill  . busPIRone (BUSPAR) 10 MG tablet   Oral   Take 10 mg by mouth 2 (two) times daily.          . clonazePAM (KLONOPIN) 0.5 MG tablet   Oral   Take 0.5 mg by mouth 2 (two) times daily as needed for anxiety.         . divalproex (DEPAKOTE ER) 500 MG 24 hr tablet   Oral   Take 500 mg by mouth 2 (two) times daily.          Marland Kitchen ibuprofen (ADVIL,MOTRIN)  200 MG tablet   Oral   Take 600 mg by mouth every 6 (six) hours as needed for pain.         . naloxone (NARCAN) 1 MG/ML injection   Intravenous   Inject 1 mg into the vein once.         Marland Kitchen QUEtiapine Fumarate (SEROQUEL XR) 150 MG 24 hr tablet   Oral   Take 150 mg by mouth 2 (two) times daily.          There were no vitals taken for this visit. Physical Exam  Nursing note and vitals reviewed. Constitutional: He is oriented to person, place, and time. He appears well-developed and well-nourished.  HENT:  Head: Normocephalic and atraumatic.  Oropharynx is patent, no obstruction, patient maintaining airway  Eyes: Conjunctivae and EOM are normal. Right eye exhibits no discharge. Left eye exhibits no discharge. No scleral icterus.  Pin point pupils  Neck: Normal range of motion. Neck supple. No JVD present.  Cardiovascular: Regular rhythm, normal heart sounds and intact distal pulses.  Exam reveals no gallop and no friction rub.   No murmur heard. Tachycardic   Pulmonary/Chest: Effort normal and breath sounds normal. No respiratory distress. He has no wheezes. He has no rales. He exhibits no tenderness.  Abdominal: Soft. Bowel sounds are  normal. He exhibits no distension and no mass. There is no tenderness. There is no rebound and no guarding.  Musculoskeletal: Normal range of motion. He exhibits no edema and no tenderness.  Neurological: He is alert and oriented to person, place, and time.  Skin: Skin is warm and dry.  Psychiatric: He has a normal mood and affect. His behavior is normal. Judgment and thought content normal.    ED Course  Procedures (including critical care time) Results for orders placed during the hospital encounter of 10/06/13  CBC WITH DIFFERENTIAL      Result Value Range   WBC 13.6 (*) 4.0 - 10.5 K/uL   RBC 5.04  4.22 - 5.81 MIL/uL   Hemoglobin 15.4  13.0 - 17.0 g/dL   HCT 16.1  09.6 - 04.5 %   MCV 87.7  78.0 - 100.0 fL   MCH 30.6  26.0 - 34.0 pg    MCHC 34.8  30.0 - 36.0 g/dL   RDW 40.9  81.1 - 91.4 %   Platelets 198  150 - 400 K/uL   Neutrophils Relative % 74  43 - 77 %   Lymphocytes Relative 19  12 - 46 %   Monocytes Relative 6  3 - 12 %   Eosinophils Relative 1  0 - 5 %   Basophils Relative 0  0 - 1 %   Neutro Abs 10.1 (*) 1.7 - 7.7 K/uL   Lymphs Abs 2.6  0.7 - 4.0 K/uL   Monocytes Absolute 0.8  0.1 - 1.0 K/uL   Eosinophils Absolute 0.1  0.0 - 0.7 K/uL   Basophils Absolute 0.0  0.0 - 0.1 K/uL  BASIC METABOLIC PANEL      Result Value Range   Sodium 139  137 - 147 mEq/L   Potassium 4.0  3.7 - 5.3 mEq/L   Chloride 100  96 - 112 mEq/L   CO2 26  19 - 32 mEq/L   Glucose, Bld 101 (*) 70 - 99 mg/dL   BUN 13  6 - 23 mg/dL   Creatinine, Ser 7.82  0.50 - 1.35 mg/dL   Calcium 9.1  8.4 - 95.6 mg/dL   GFR calc non Af Amer >90  >90 mL/min   GFR calc Af Amer >90  >90 mL/min  TROPONIN I      Result Value Range   Troponin I <0.30  <0.30 ng/mL  ACETAMINOPHEN LEVEL      Result Value Range   Acetaminophen (Tylenol), Serum <15.0  10 - 30 ug/mL  ETHANOL      Result Value Range   Alcohol, Ethyl (B) <11  0 - 11 mg/dL  SALICYLATE LEVEL      Result Value Range   Salicylate Lvl <2.0 (*) 2.8 - 20.0 mg/dL   Ct Abdomen Pelvis W Contrast  09/19/2013   CLINICAL DATA:  Right-sided abdominal pain and distention. Patient was moving a couch and it fell on him.  EXAM: CT ABDOMEN AND PELVIS WITH CONTRAST  TECHNIQUE: Multidetector CT imaging of the abdomen and pelvis was performed using the standard protocol following bolus administration of intravenous contrast.  CONTRAST:  50mL OMNIPAQUE IOHEXOL 300 MG/ML SOLN, OMNIPAQUE IOHEXOL 300 MG/ML SOLN  COMPARISON:  06/03/2013  FINDINGS: Mild dependent changes in the lung bases.  The liver, spleen, gallbladder, pancreas, adrenal glands, kidneys, abdominal aorta, inferior vena cava, and retroperitoneal lymph nodes are unremarkable. There is a fat containing periumbilical hernia with some stranding suggesting  possible fat necrosis. The appearance is  stable since previous study. The stomach, small bowel, and colon are not abnormally distended. Stool fills the colon. No free air or free fluid in the abdomen.  Pelvis: The appendix is normal. Bladder wall is not thickened. Prostate gland is not enlarged. No free or loculated pelvic fluid collections. No diverticulitis.  Normal alignment of the lumbar spine. No destructive bone lesions or displaced fractures identified.  IMPRESSION: No acute process demonstrated in the abdomen or pelvis. Stable appearance of fat containing periumbilical hernia.   Electronically Signed   By: Burman NievesWilliam  Stevens M.D.   On: 09/19/2013 21:56   Dg Chest Portable 1 View  10/06/2013   CLINICAL DATA:  Drug overdose.  Tobacco use.  EXAM: PORTABLE CHEST - 1 VIEW  COMPARISON:  DG ABD ACUTE W/CHEST dated 09/19/2013; CT ABD/PELVIS W CM dated 09/19/2013  FINDINGS: The heart size and mediastinal contours are within normal limits. Both lungs are clear. The visualized skeletal structures are unremarkable.  IMPRESSION: No active disease.   Electronically Signed   By: Herbie BaltimoreWalt  Liebkemann M.D.   On: 10/06/2013 17:12   Dg Abd Acute W/chest  09/19/2013   CLINICAL DATA:  Abdominal pain  EXAM: ACUTE ABDOMEN SERIES (ABDOMEN 2 VIEW & CHEST 1 VIEW)  COMPARISON:  None.  FINDINGS: There is no evidence of dilated bowel loops or free intraperitoneal air. No radiopaque calculi or other significant radiographic abnormality is seen. Heart size and mediastinal contours are within normal limits. Both lungs are clear. Moderate-to-large amount of stool is appreciated within the colon.  IMPRESSION: Negative abdominal radiographs. Moderate to large amount of fecal retention. No acute cardiopulmonary disease.   Electronically Signed   By: Salome HolmesHector  Cooper M.D.   On: 09/19/2013 17:52      EKG Interpretation   None     ED ECG REPORT  I personally interpreted this EKG   Date: 10/06/2013   Rate: 99  Rhythm: normal sinus  rhythm  QRS Axis: normal  Intervals: normal  ST/T Wave abnormalities: normal  Conduction Disutrbances:none  Narrative Interpretation:   Old EKG Reviewed: none available    MDM   1. Drug overdose      4:38 PM Patient discussed with Dr. Freida BusmanAllen.  Will give additional narcan.  Check labs.  Patient's grandmother states that she thinks the patient was trying to kill himself.  Will consult ACT.  Patient has been seen by and discussed with Dr. Freida BusmanAllen, who recommends psych work-up.  9:00 PM Labs are back, re-discussed with Dr. Freida BusmanAllen.  Patient has required 2mg  of narcan.  Will order narcan drip and admit.  Roxy Horsemanobert Anyia Gierke, PA-C 10/06/13 2101

## 2013-10-06 NOTE — ED Notes (Signed)
Tonda(Grandmother) [phone]

## 2013-10-06 NOTE — ED Notes (Signed)
2 bags of clothes taken and locked up. needle disposed into sharp box, security wanded pt and belongings <[employer] at bedside

## 2013-10-07 DIAGNOSIS — F1120 Opioid dependence, uncomplicated: Secondary | ICD-10-CM

## 2013-10-07 LAB — CBC
HCT: 42.5 % (ref 39.0–52.0)
Hemoglobin: 14.2 g/dL (ref 13.0–17.0)
MCH: 29.6 pg (ref 26.0–34.0)
MCHC: 33.4 g/dL (ref 30.0–36.0)
MCV: 88.5 fL (ref 78.0–100.0)
PLATELETS: 180 10*3/uL (ref 150–400)
RBC: 4.8 MIL/uL (ref 4.22–5.81)
RDW: 13.2 % (ref 11.5–15.5)
WBC: 9.7 10*3/uL (ref 4.0–10.5)

## 2013-10-07 LAB — COMPREHENSIVE METABOLIC PANEL
ALK PHOS: 59 U/L (ref 39–117)
ALT: 13 U/L (ref 0–53)
AST: 18 U/L (ref 0–37)
Albumin: 3.5 g/dL (ref 3.5–5.2)
BILIRUBIN TOTAL: 0.4 mg/dL (ref 0.3–1.2)
BUN: 12 mg/dL (ref 6–23)
CO2: 26 meq/L (ref 19–32)
Calcium: 8.2 mg/dL — ABNORMAL LOW (ref 8.4–10.5)
Chloride: 102 mEq/L (ref 96–112)
Creatinine, Ser: 1.01 mg/dL (ref 0.50–1.35)
GLUCOSE: 101 mg/dL — AB (ref 70–99)
POTASSIUM: 3.9 meq/L (ref 3.7–5.3)
SODIUM: 139 meq/L (ref 137–147)
Total Protein: 6.1 g/dL (ref 6.0–8.3)

## 2013-10-07 LAB — MRSA PCR SCREENING: MRSA by PCR: POSITIVE — AB

## 2013-10-07 LAB — ACETAMINOPHEN LEVEL: Acetaminophen (Tylenol), Serum: <15 ug/mL (ref 10–30)

## 2013-10-07 MED ORDER — CLONIDINE HCL 0.1 MG PO TABS
0.1000 mg | ORAL_TABLET | Freq: Four times a day (QID) | ORAL | Status: AC
  Administered 2013-10-07 – 2013-10-08 (×7): 0.1 mg via ORAL
  Filled 2013-10-07 (×8): qty 1

## 2013-10-07 MED ORDER — LOPERAMIDE HCL 2 MG PO CAPS
2.0000 mg | ORAL_CAPSULE | ORAL | Status: DC | PRN
Start: 2013-10-07 — End: 2013-10-09

## 2013-10-07 MED ORDER — CLONIDINE HCL 0.1 MG PO TABS
0.1000 mg | ORAL_TABLET | ORAL | Status: DC
  Filled 2013-10-07 (×3): qty 1

## 2013-10-07 MED ORDER — MUPIROCIN 2 % EX OINT
1.0000 "application " | TOPICAL_OINTMENT | Freq: Two times a day (BID) | CUTANEOUS | Status: DC
  Administered 2013-10-07 – 2013-10-09 (×5): 1 via NASAL
  Filled 2013-10-07: qty 22

## 2013-10-07 MED ORDER — METHOCARBAMOL 500 MG PO TABS: 500.0000 mg | ORAL_TABLET | Freq: Three times a day (TID) | ORAL | Status: DC | PRN

## 2013-10-07 MED ORDER — CHLORHEXIDINE GLUCONATE CLOTH 2 % EX PADS
6.0000 | MEDICATED_PAD | Freq: Every day | CUTANEOUS | Status: DC
  Administered 2013-10-07 – 2013-10-09 (×3): 6 via TOPICAL

## 2013-10-07 MED ORDER — NICOTINE 14 MG/24HR TD PT24
14.0000 mg | MEDICATED_PATCH | Freq: Every day | TRANSDERMAL | Status: DC
  Administered 2013-10-07 – 2013-10-09 (×3): 14 mg via TRANSDERMAL
  Filled 2013-10-07 (×3): qty 1

## 2013-10-07 MED ORDER — METHADONE HCL 10 MG PO TABS
10.0000 mg | ORAL_TABLET | Freq: Every day | ORAL | Status: DC
  Administered 2013-10-07: 10 mg via ORAL
  Filled 2013-10-07: qty 1

## 2013-10-07 MED ORDER — NAPROXEN 500 MG PO TABS: 500.0000 mg | ORAL_TABLET | Freq: Two times a day (BID) | ORAL | Status: DC | PRN

## 2013-10-07 MED ORDER — SODIUM CHLORIDE 0.9 % IV SOLN
INTRAVENOUS | Status: DC
  Administered 2013-10-07 – 2013-10-08 (×2): via INTRAVENOUS

## 2013-10-07 MED ORDER — ONDANSETRON 4 MG PO TBDP: 4.0000 mg | ORAL_TABLET | Freq: Four times a day (QID) | ORAL | Status: DC | PRN

## 2013-10-07 MED ORDER — HYDROXYZINE HCL 25 MG PO TABS: 25.0000 mg | ORAL_TABLET | Freq: Four times a day (QID) | ORAL | Status: DC | PRN

## 2013-10-07 MED ORDER — BUSPIRONE HCL 10 MG PO TABS
10.0000 mg | ORAL_TABLET | Freq: Two times a day (BID) | ORAL | Status: DC
  Administered 2013-10-07 – 2013-10-09 (×5): 10 mg via ORAL
  Filled 2013-10-07 (×7): qty 1

## 2013-10-07 MED ORDER — CLONIDINE HCL 0.1 MG PO TABS
0.1000 mg | ORAL_TABLET | Freq: Every day | ORAL | Status: DC
  Administered 2013-10-09: 0.1 mg via ORAL

## 2013-10-07 MED ORDER — DICYCLOMINE HCL 20 MG PO TABS
20.0000 mg | ORAL_TABLET | Freq: Four times a day (QID) | ORAL | Status: DC | PRN
Start: 2013-10-07 — End: 2013-10-09
  Administered 2013-10-07: 20 mg via ORAL
  Filled 2013-10-07: qty 1

## 2013-10-07 MED ORDER — QUETIAPINE FUMARATE 300 MG PO TABS
300.0000 mg | ORAL_TABLET | Freq: Every day | ORAL | Status: DC
  Administered 2013-10-07: 300 mg via ORAL
  Filled 2013-10-07 (×2): qty 1
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
## 2013-10-07 NOTE — Progress Notes (Signed)
Clinical Social Work  CSW received referral to complete psychosocial assessment. CSW spoke with RN who reports patient is very anxious at this time and would struggle with participating fully in assessment. CSW agreeable to follow up at later time.  StaplesHolly Nashay [place], [place] [phone]

## 2013-10-07 NOTE — Progress Notes (Signed)
UR completed. Patient changed r/t requiring Narcan gtt.

## 2013-10-07 NOTE — Progress Notes (Signed)
[facility] PROGRESS NOTE  Scott Weaver ZOX:[id number] DOB: [birth year] DOA: 10/06/2013 PCP: Default, Provider, MD  Assessment/Plan: 1. Heroin/Opiate OD -pt reports unintentional OD -off narcan gtt  -Psych consult -Tx to floor  2. Opiate withdrawal -start Clonidine detox protocol with antiemetics, loperamide, dicyclomine  3. URI -likely viral, asymptomatic at this time, will stop Abx  4. Bipolar d/o/anxiety -continue depakote, resume Seroquel and Buspar   Code Status: Full Code Family Communication: none at bedside Disposition Plan: home    Consultants:  Psych pending  HPI/Subjective: Feels fine, reports unintentional OD  Objective: Filed Vitals:   10/07/13 0800  BP:   Pulse:   Temp: 98 F (36.7 C)  Resp:     Intake/Output Summary (Last 24 hours) at 10/07/13 1151 Last data filed at 10/07/13 0900  Gross per 24 hour  Intake 1296.18 ml  Output   1150 ml  Net 146.18 ml   Filed Weights   10/06/13 2103 10/07/13 0343  Weight: 69.3 kg (152 lb 12.5 oz) 70.5 kg (155 lb 6.8 oz)    Exam:   General: AAOx3, no distress, no tremors  Cardiovascular: S1S2/RRR  Respiratory: CTAB  Abdomen: soft, Nt, BS present  Musculoskeletal: no edema c/c   Data Reviewed: Basic Metabolic Panel:  Recent Labs Lab 10/06/13 1642 10/07/13 0243  NA 139 139  K 4.0 3.9  CL 100 102  CO2 26 26  GLUCOSE 101* 101*  BUN 13 12  CREATININE 1.07 1.01  CALCIUM 9.1 8.2*   Liver Function Tests:  Recent Labs Lab 10/06/13 2115 10/07/13 0243  AST 21 18  ALT 15 13  ALKPHOS 66 59  BILITOT 0.5 0.4  PROT 6.6 6.1  ALBUMIN 4.0 3.5   No results found for this basename: LIPASE, AMYLASE,  in the last 168 hours No results found for this basename: AMMONIA,  in the last 168 hours CBC:  Recent Labs Lab 10/06/13 1642 10/07/13 0243  WBC 13.6* 9.7  NEUTROABS 10.1*  --   HGB 15.4 14.2  HCT 44.2 42.5  MCV 87.7 88.5  PLT 198 180   Cardiac Enzymes:  Recent Labs Lab  10/06/13 1642  TROPONINI <0.30   BNP (last 3 results) No results found for this basename: PROBNP,  in the last 8760 hours CBG: No results found for this basename: GLUCAP,  in the last 168 hours  Recent Results (from the past 240 hour(s))  MRSA PCR SCREENING     Status: Abnormal   Collection Time    10/07/13  1:02 AM      Result Value Range Status   MRSA by PCR POSITIVE (*) NEGATIVE Final   Comment:            The GeneXpert MRSA Assay (FDA     approved for NASAL specimens     only), is one component of a     comprehensive MRSA colonization     surveillance program. It is not     intended to diagnose MRSA     infection nor to guide or     monitor treatment for     MRSA infections.     RESULT CALLED TO, READ BACK BY AND VERIFIED WITH:     BWHITE RN AT 0335 ON [id number] BY DLONG     Studies: Dg Chest Portable 1 View  10/06/2013   CLINICAL DATA:  Drug overdose.  Tobacco use.  EXAM: PORTABLE CHEST - 1 VIEW  COMPARISON:  DG ABD ACUTE W/CHEST dated 09/19/2013; CT ABD/PELVIS W  CM dated 09/19/2013  FINDINGS: The heart size and mediastinal contours are within normal limits. Both lungs are clear. The visualized skeletal structures are unremarkable.  IMPRESSION: No active disease.   Electronically Signed   By: Herbie Baltimore M.D.   On: 10/06/2013 17:12    Scheduled Meds: . Chlorhexidine Gluconate Cloth  6 each Topical Q0600  . cloNIDine  0.1 mg Oral QID   Followed by  . [START ON 10/09/2013] cloNIDine  0.1 mg Oral BH-qamhs   Followed by  . [START ON 10/11/2013] cloNIDine  0.1 mg Oral QAC breakfast  . divalproex  500 mg Oral BID  . mupirocin ointment  1 application Nasal BID  . sodium chloride  3 mL Intravenous Q12H   Continuous Infusions: . sodium chloride      Principal Problem:   Mental status, decreased Active Problems:   Heroin overdose   Overdose    Time spent:    [facility]  [facility] Pager [phone]. If 7PM-7AM, please contact night-coverage  at [email or web address], password [email or web address] 10/07/2013, 11:51 AM  LOS: 1 day

## 2013-10-07 NOTE — Consult Note (Signed)
[name] Face-to-Face Psychiatry Consult   Reason for Consult:  Accidental overdose on heroine Referring Physician:  Dr Fenton Foy is an 23 y.o. male.  Assessment: AXIS I:  Bipolar, Depressed and Opiate dependence AXIS II:  Deferred AXIS III:   Past Medical History  Diagnosis Date  . Bipolar 1 disorder   . Anxiety    AXIS IV:  other psychosocial or environmental problems and problems related to social environment AXIS V:  51-60 moderate symptoms  Plan:  Patient does not meet criteria for psychiatric inpatient admission. Supportive therapy provided about ongoing stressors. Discussed crisis plan, support from social network, calling 911, coming to the Emergency Department, and calling Suicide Hotline.  Subjective:   Scott Weaver is a 23 y.o. male patient admitted with accidental overdose.  HPI:  Patient seen chart reviewed.  Patient's 23 year old Caucasian unemployed man who was admitted on the medical floor after taking accidental overdose on heroin and oxycodone pills.  He was found unresponsive and he received emergency treatment.  Patient told the past 2 months he has been living in a shelter and sheltered environment has been very disturbing and disappointing.  He was last in to the hospital in November 2014 because of his depression.  Patient has no place to live.  He is living in a shelter.  He has 19-year-old daughter who lives with grandmother.  Patient has very limited contact with his biological parents.  His only support system is grandmother.  Patient is currently not working.  Patient told that it was not a suicidal attempt.  He just started taking drugs but he started living in shelter to deal with his anxiety and depression.  He is seeing [facility] and getting treatment there.  Patient told he requires rehabilitation because he does not want to use drugs and does not want to kill himself.  Patient told he has never tried to kill himself and he will never do it because  of his 20-year-old daughter.  He denies any hallucination, paranoia, agitation, irritability but does appear anxious and nervous.  Patient told inpatient psychiatric treatment in November does not help.  He wants to be treatment for his drug use to like to go to rehabilitation.  Patient denies any active suicidal thoughts or homicidal thoughts.  He denies any paranoia or any hallucination.  He likes to continue his Depakote BuSpar and Seroquel.  Patient does not want to associate with his relatives because he believed living with them did not help him. HPI Elements:   Location:  Medical floor. Quality:  Fair. Severity:  Mild. Timing:  2 months.  Past Psychiatric History: Past Medical History  Diagnosis Date  . Bipolar 1 disorder   . Anxiety     reports that he has been smoking Cigarettes.  He has been smoking about 0.50 packs per day. He has never used smokeless tobacco. He reports that he does not drink alcohol or use illicit drugs. History reviewed. No pertinent family history.       Abuse/Neglect [facility]) Physical Abuse: Denies Verbal Abuse: Denies Sexual Abuse: Denies Allergies:   Allergies  Allergen Reactions  . Tramadol     "throat swells up and lymph nodes"  . Penicillins Hives    ACT Assessment Complete:  Yes:    Educational Status    Risk to Self: Risk to self Is patient at risk for suicide?: No Substance abuse history and/or treatment for substance abuse?: Yes  Risk to Others:    Abuse: Abuse/Neglect  Assessment (Assessment to be complete while patient is alone) Physical Abuse: Denies Verbal Abuse: Denies Sexual Abuse: Denies Exploitation of patient/patient's resources: Denies Self-Neglect: Denies  Prior Inpatient Therapy:    Prior Outpatient Therapy:    Additional Information:                    Objective: Blood pressure 121/52, pulse 85, temperature 98 F (36.7 C), temperature source Oral, resp. rate 18, height 5\' 7"  (1.702 m), weight 155 lb 6.8 oz  (70.5 kg), SpO2 98.00%.Body mass index is 24.34 kg/(m^2). Results for orders placed during the hospital encounter of 10/06/13 (from the past 72 hour(s))  CBC WITH DIFFERENTIAL     Status: Abnormal   Collection Time    10/06/13  4:42 PM      Result Value Range   WBC 13.6 (*) 4.0 - 10.5 K/uL   RBC 5.04  4.22 - 5.81 MIL/uL   Hemoglobin 15.4  13.0 - 17.0 g/dL   HCT 10/08/13  12.7 - 37.5 %   MCV 87.7  78.0 - 100.0 fL   MCH 30.6  26.0 - 34.0 pg   MCHC 34.8  30.0 - 36.0 g/dL   RDW 53.6  31.8 - 66.6 %   Platelets 198  150 - 400 K/uL   Neutrophils Relative % 74  43 - 77 %   Lymphocytes Relative 19  12 - 46 %   Monocytes Relative 6  3 - 12 %   Eosinophils Relative 1  0 - 5 %   Basophils Relative 0  0 - 1 %   Neutro Abs 10.1 (*) 1.7 - 7.7 K/uL   Lymphs Abs 2.6  0.7 - 4.0 K/uL   Monocytes Absolute 0.8  0.1 - 1.0 K/uL   Eosinophils Absolute 0.1  0.0 - 0.7 K/uL   Basophils Absolute 0.0  0.0 - 0.1 K/uL  BASIC METABOLIC PANEL     Status: Abnormal   Collection Time    10/06/13  4:42 PM      Result Value Range   Sodium 139  137 - 147 mEq/L   Potassium 4.0  3.7 - 5.3 mEq/L   Chloride 100  96 - 112 mEq/L   CO2 26  19 - 32 mEq/L   Glucose, Bld 101 (*) 70 - 99 mg/dL   BUN 13  6 - 23 mg/dL   Creatinine, Ser 10/08/13  0.50 - 1.35 mg/dL   Calcium 9.1  8.4 - 3.55 mg/dL   GFR calc non Af Amer >90  >90 mL/min   GFR calc Af Amer >90  >90 mL/min   Comment: (NOTE)     The eGFR has been calculated using the CKD EPI equation.     This calculation has not been validated in all clinical situations.     eGFR's persistently <90 mL/min signify possible Chronic Kidney     Disease.  TROPONIN I     Status: None   Collection Time    10/06/13  4:42 PM      Result Value Range   Troponin I <0.30  <0.30 ng/mL   Comment:            Due to the release kinetics of cTnI,     a negative result within the first hours     of the onset of symptoms does not rule out     myocardial infarction with certainty.     If myocardial  infarction is still suspected,     repeat  the test at appropriate intervals.  ACETAMINOPHEN LEVEL     Status: None   Collection Time    10/06/13  7:05 PM      Result Value Range   Acetaminophen (Tylenol), Serum <15.0  10 - 30 ug/mL   Comment:            THERAPEUTIC CONCENTRATIONS VARY     SIGNIFICANTLY. A RANGE OF 10-30     ug/mL MAY BE AN EFFECTIVE     CONCENTRATION FOR MANY PATIENTS.     HOWEVER, SOME ARE BEST TREATED     AT CONCENTRATIONS OUTSIDE THIS     RANGE.     ACETAMINOPHEN CONCENTRATIONS     >150 ug/mL AT 4 HOURS AFTER     INGESTION AND >50 ug/mL AT 12     HOURS AFTER INGESTION ARE     OFTEN ASSOCIATED WITH TOXIC     REACTIONS.  ETHANOL     Status: None   Collection Time    10/06/13  7:05 PM      Result Value Range   Alcohol, Ethyl (B) <11  0 - 11 mg/dL   Comment:            LOWEST DETECTABLE LIMIT FOR     SERUM ALCOHOL IS 11 mg/dL     FOR MEDICAL PURPOSES ONLY  SALICYLATE LEVEL     Status: Abnormal   Collection Time    10/06/13  7:05 PM      Result Value Range   Salicylate Lvl <3.4 (*) 2.8 - 20.0 mg/dL  VALPROIC ACID LEVEL     Status: Abnormal   Collection Time    10/06/13  7:05 PM      Result Value Range   Valproic Acid Lvl <10.0 (*) 50.0 - 100.0 ug/mL   Comment: Performed at [facility] ([facility])     Status: Abnormal   Collection Time    10/06/13  8:32 PM      Result Value Range   Opiates POSITIVE (*) NONE DETECTED   Cocaine NONE DETECTED  NONE DETECTED   Benzodiazepines NONE DETECTED  NONE DETECTED   Amphetamines NONE DETECTED  NONE DETECTED   Tetrahydrocannabinol POSITIVE (*) NONE DETECTED   Barbiturates NONE DETECTED  NONE DETECTED   Comment:            DRUG SCREEN FOR MEDICAL PURPOSES     ONLY.  IF CONFIRMATION IS NEEDED     FOR ANY PURPOSE, NOTIFY LAB     WITHIN 5 DAYS.                LOWEST DETECTABLE LIMITS     FOR URINE DRUG SCREEN     Drug Class       Cutoff (ng/mL)     Amphetamine      1000      Barbiturate      200     Benzodiazepine   742     Tricyclics       595     Opiates          300     Cocaine          300     THC              50  HEPATIC FUNCTION PANEL     Status: None   Collection Time    10/06/13  9:15 PM      Result Value Range  Total Protein 6.6  6.0 - 8.3 g/dL   Albumin 4.0  3.5 - 5.2 g/dL   AST 21  0 - 37 U/L   ALT 15  0 - 53 U/L   Alkaline Phosphatase 66  39 - 117 U/L   Total Bilirubin 0.5  0.3 - 1.2 mg/dL   Bilirubin, Direct <0.2  0.0 - 0.3 mg/dL   Indirect Bilirubin NOT CALCULATED  0.3 - 0.9 mg/dL  MRSA PCR SCREENING     Status: Abnormal   Collection Time    10/07/13  1:02 AM      Result Value Range   MRSA by PCR POSITIVE (*) NEGATIVE   Comment:            The GeneXpert MRSA Assay (FDA     approved for NASAL specimens     only), is one component of a     comprehensive MRSA colonization     surveillance program. It is not     intended to diagnose MRSA     infection nor to guide or     monitor treatment for     MRSA infections.     RESULT CALLED TO, READ BACK BY AND VERIFIED WITH:     BWHITE RN AT 0335 ON [id number] BY DLONG  ACETAMINOPHEN LEVEL     Status: None   Collection Time    10/07/13  2:43 AM      Result Value Range   Acetaminophen (Tylenol), Serum <15.0  10 - 30 ug/mL   Comment:            THERAPEUTIC CONCENTRATIONS VARY     SIGNIFICANTLY. A RANGE OF 10-30     ug/mL MAY BE AN EFFECTIVE     CONCENTRATION FOR MANY PATIENTS.     HOWEVER, SOME ARE BEST TREATED     AT CONCENTRATIONS OUTSIDE THIS     RANGE.     ACETAMINOPHEN CONCENTRATIONS     >150 ug/mL AT 4 HOURS AFTER     INGESTION AND >50 ug/mL AT 12     HOURS AFTER INGESTION ARE     OFTEN ASSOCIATED WITH TOXIC     REACTIONS.  COMPREHENSIVE METABOLIC PANEL     Status: Abnormal   Collection Time    10/07/13  2:43 AM      Result Value Range   Sodium 139  137 - 147 mEq/L   Potassium 3.9  3.7 - 5.3 mEq/L   Chloride 102  96 - 112 mEq/L   CO2 26  19 - 32 mEq/L   Glucose, Bld 101  (*) 70 - 99 mg/dL   BUN 12  6 - 23 mg/dL   Creatinine, Ser 1.01  0.50 - 1.35 mg/dL   Calcium 8.2 (*) 8.4 - 10.5 mg/dL   Total Protein 6.1  6.0 - 8.3 g/dL   Albumin 3.5  3.5 - 5.2 g/dL   AST 18  0 - 37 U/L   ALT 13  0 - 53 U/L   Alkaline Phosphatase 59  39 - 117 U/L   Total Bilirubin 0.4  0.3 - 1.2 mg/dL   GFR calc non Af Amer >90  >90 mL/min   GFR calc Af Amer >90  >90 mL/min   Comment: (NOTE)     The eGFR has been calculated using the CKD EPI equation.     This calculation has not been validated in all clinical situations.     eGFR's persistently <90 mL/min signify possible Chronic Kidney  Disease.  CBC     Status: None   Collection Time    10/07/13  2:43 AM      Result Value Range   WBC 9.7  4.0 - 10.5 K/uL   RBC 4.80  4.22 - 5.81 MIL/uL   Hemoglobin 14.2  13.0 - 17.0 g/dL   HCT 42.5  39.0 - 52.0 %   MCV 88.5  78.0 - 100.0 fL   MCH 29.6  26.0 - 34.0 pg   MCHC 33.4  30.0 - 36.0 g/dL   RDW 13.2  11.5 - 15.5 %   Platelets 180  150 - 400 K/uL   Labs are reviewed.  Current Facility-Administered Medications  Medication Dose Route Frequency Provider Last Rate Last Dose  . 0.9 %  sodium chloride infusion   Intravenous Continuous Domenic Polite, MD 75 mL/hr at 10/07/13 1256    . busPIRone (BUSPAR) tablet 10 mg  10 mg Oral BID Domenic Polite, MD   10 mg at 10/07/13 1255  . Chlorhexidine Gluconate Cloth 2 % PADS 6 each  6 each Topical Q0600 Domenic Polite, MD      . cloNIDine (CATAPRES) tablet 0.1 mg  0.1 mg Oral QID Domenic Polite, MD   0.1 mg at 10/07/13 1255   Followed by  . [START ON 10/09/2013] cloNIDine (CATAPRES) tablet 0.1 mg  0.1 mg Oral BH-qamhs Domenic Polite, MD       Followed by  . [START ON 10/11/2013] cloNIDine (CATAPRES) tablet 0.1 mg  0.1 mg Oral QAC breakfast Domenic Polite, MD      . dicyclomine (BENTYL) tablet 20 mg  20 mg Oral Q6H PRN Domenic Polite, MD   20 mg at 10/07/13 1033  . divalproex (DEPAKOTE ER) 24 hr tablet 500 mg  500 mg Oral BID Thurnell Lose,  MD   500 mg at 10/07/13 1018  . guaiFENesin-dextromethorphan (ROBITUSSIN DM) 100-10 MG/5ML syrup 5 mL  5 mL Oral Q4H PRN Thurnell Lose, MD      . ibuprofen (ADVIL,MOTRIN) tablet 600 mg  600 mg Oral Q6H PRN Thurnell Lose, MD   600 mg at 10/06/13 2219  . loperamide (IMODIUM) capsule 2-4 mg  2-4 mg Oral PRN Domenic Polite, MD      . mupirocin ointment (BACTROBAN) 2 % 1 application  1 application Nasal BID Domenic Polite, MD   1 application at 09/38/18 1019  . nicotine (NICODERM CQ - dosed in mg/24 hours) patch 14 mg  14 mg Transdermal Daily Domenic Polite, MD      . ondansetron [facility]) injection 4 mg  4 mg Intravenous Q6H PRN Thurnell Lose, MD   4 mg at 10/07/13 1010  . QUEtiapine (SEROQUEL) tablet 300 mg  300 mg Oral QHS Domenic Polite, MD      . sodium chloride 0.9 % injection 3 mL  3 mL Intravenous Q12H Thurnell Lose, MD   3 mL at 10/06/13 2200    Psychiatric Specialty Exam:     Blood pressure 121/52, pulse 85, temperature 98 F (36.7 C), temperature source Oral, resp. rate 18, height $RemoveBe'5\' 7"'gVsGsdfHJ$  (1.702 m), weight 155 lb 6.8 oz (70.5 kg), SpO2 98.00%.Body mass index is 24.34 kg/(m^2).  General Appearance: Casual  Eye Contact::  Fair  Speech:  Slow  Volume:  Decreased  Mood:  Anxious  Affect:  Congruent  Thought Process:  Logical  Orientation:  Full (Time, Place, and Person)  Thought Content:  Rumination  Suicidal Thoughts:  No  Homicidal Thoughts:  No  Memory:  Immediate;   Good Recent;   Good Remote;   Good  Judgement:  Intact  Insight:  Good  Psychomotor Activity:  Decreased  Concentration:  Fair  Recall:  Fair  Akathisia:  No  Handed:  Right  AIMS (if indicated):     Assets:  Communication Skills Desire for Improvement  Sleep:      Treatment Plan Summary: Medication management Patient does not meet criteria for inpatient psychiatric services.  He wants to get treatment for his drug use.  He is looking for rehabilitation.  Patient told it was not intentional  overdose and he is so scared that he does not want to live in a shelter and use drugs.  Can be discharged with recommendation to see [facility] and also to get treatment for his drug use.  Social worker please arrange a referral for rehabilitation.  Please call [phone] if there any further questions.  Damica Gravlin T. 10/07/2013 3:00 PM

## 2013-10-07 NOTE — Progress Notes (Signed)
Patient having withdrawal symptoms.  Dr. Jomarie LongsJoseph notified, and will start a new medication.  Philomena Dohenyavid [employer] RN

## 2013-10-08 LAB — COMPREHENSIVE METABOLIC PANEL
ALBUMIN: 3.4 g/dL — AB (ref 3.5–5.2)
ALK PHOS: 66 U/L (ref 39–117)
ALT: 18 U/L (ref 0–53)
AST: 22 U/L (ref 0–37)
BUN: 7 mg/dL (ref 6–23)
CALCIUM: 8.3 mg/dL — AB (ref 8.4–10.5)
CO2: 22 mEq/L (ref 19–32)
CREATININE: 0.89 mg/dL (ref 0.50–1.35)
Chloride: 101 mEq/L (ref 96–112)
GFR calc non Af Amer: >90 mL/min (ref >90)
GLUCOSE: 141 mg/dL — AB (ref 70–99)
POTASSIUM: 3.4 meq/L — AB (ref 3.7–5.3)
Sodium: 137 mEq/L (ref 137–147)
TOTAL PROTEIN: 6.3 g/dL (ref 6.0–8.3)
Total Bilirubin: 0.5 mg/dL (ref 0.3–1.2)

## 2013-10-08 LAB — CBC
HEMATOCRIT: 42.6 % (ref 39.0–52.0)
HEMOGLOBIN: 14.8 g/dL (ref 13.0–17.0)
MCH: 30.1 pg (ref 26.0–34.0)
MCHC: 34.7 g/dL (ref 30.0–36.0)
MCV: 86.6 fL (ref 78.0–100.0)
Platelets: 120 10*3/uL — ABNORMAL LOW (ref 150–400)
RBC: 4.92 MIL/uL (ref 4.22–5.81)
RDW: 13.2 % (ref 11.5–15.5)
WBC: 9.3 10*3/uL (ref 4.0–10.5)

## 2013-10-08 NOTE — Progress Notes (Signed)
[facility] PROGRESS NOTE  MARQUEE FUCHS Weaver:[id number] DOB: 25-Jul-1991 DOA: 10/06/2013 PCP: Default, Provider, MD  Assessment/Plan: 1. Heroin/Opiate OD -pt reports unintentional OD -off narcan gtt  -Psych consult noted, provided with info on outpatient resurces  2. Opiate withdrawal -continue Clonidine detox protocol with antiemetics, loperamide, dicyclomine -still symptomatic though less than yesterday  3. Fever -likely due to 2 -monitor for now  4. Bipolar d/o/anxiety -continue depakote, resume Seroquel and Buspar   Code Status: Full Code Family Communication: none at bedside Disposition Plan: home    Consultants:  Psych pending  HPI/Subjective: Severe withdrawals yesterday Today with nausea/vomiting, cramps, anxiety etc  Objective: Filed Vitals:   10/08/13 1030  BP: 117/56  Pulse:   Temp:   Resp:     Intake/Output Summary (Last 24 hours) at 10/08/13 1155 Last data filed at 10/08/13 0900  Gross per 24 hour  Intake    950 ml  Output      0 ml  Net    950 ml   Filed Weights   10/06/13 2103 10/07/13 0343 10/08/13 0724  Weight: 69.3 kg (152 lb 12.5 oz) 70.5 kg (155 lb 6.8 oz) 72.6 kg (160 lb 0.9 oz)    Exam:   General: AAOx3, no distress, no tremors  Cardiovascular: S1S2/RRR  Respiratory: CTAB  Abdomen: soft, Nt, BS present  Musculoskeletal: no edema c/c   Data Reviewed: Basic Metabolic Panel:  Recent Labs Lab 10/06/13 1642 10/07/13 0243 10/08/13 0819  NA 139 139 137  K 4.0 3.9 3.4*  CL 100 102 101  CO2 26 26 22   GLUCOSE 101* 101* 141*  BUN 13 12 7   CREATININE 1.07 1.01 0.89  CALCIUM 9.1 8.2* 8.3*   Liver Function Tests:  Recent Labs Lab 10/06/13 2115 10/07/13 0243 10/08/13 0819  AST 21 18 22   ALT 15 13 18   ALKPHOS 66 59 66  BILITOT 0.5 0.4 0.5  PROT 6.6 6.1 6.3  ALBUMIN 4.0 3.5 3.4*   No results found for this basename: LIPASE, AMYLASE,  in the last 168 hours No results found for this basename: AMMONIA,  in  the last 168 hours CBC:  Recent Labs Lab 10/06/13 1642 10/07/13 0243 10/08/13 0819  WBC 13.6* 9.7 9.3  NEUTROABS 10.1*  --   --   HGB 15.4 14.2 14.8  HCT 44.2 42.5 42.6  MCV 87.7 88.5 86.6  PLT 198 180 120*   Cardiac Enzymes:  Recent Labs Lab 10/06/13 1642  TROPONINI <0.30   BNP (last 3 results) No results found for this basename: PROBNP,  in the last 8760 hours CBG: No results found for this basename: GLUCAP,  in the last 168 hours  Recent Results (from the past 240 hour(s))  MRSA PCR SCREENING     Status: Abnormal   Collection Time    10/07/13  1:02 AM      Result Value Range Status   MRSA by PCR POSITIVE (*) NEGATIVE Final   Comment:            The GeneXpert MRSA Assay (FDA     approved for NASAL specimens     only), is one component of a     comprehensive MRSA colonization     surveillance program. It is not     intended to diagnose MRSA     infection nor to guide or     monitor treatment for     MRSA infections.     RESULT CALLED TO, READ BACK BY AND VERIFIED  WITH:     BWHITE RN AT 0335 ON [id number] BY DLONG     Studies: Dg Chest Portable 1 View  10/06/2013   CLINICAL DATA:  Drug overdose.  Tobacco use.  EXAM: PORTABLE CHEST - 1 VIEW  COMPARISON:  DG ABD ACUTE W/CHEST dated 09/19/2013; CT ABD/PELVIS W CM dated 09/19/2013  FINDINGS: The heart size and mediastinal contours are within normal limits. Both lungs are clear. The visualized skeletal structures are unremarkable.  IMPRESSION: No active disease.   Electronically Signed   By: Herbie BaltimoreWalt  Liebkemann M.D.   On: 10/06/2013 17:12    Scheduled Meds: . busPIRone  10 mg Oral BID  . Chlorhexidine Gluconate Cloth  6 each Topical Q0600  . cloNIDine  0.1 mg Oral QID   Followed by  . [START ON 10/09/2013] cloNIDine  0.1 mg Oral BH-qamhs   Followed by  . [START ON 10/11/2013] cloNIDine  0.1 mg Oral QAC breakfast  . divalproex  500 mg Oral BID  . mupirocin ointment  1 application Nasal BID  . nicotine  14 mg  Transdermal Daily  . sodium chloride  3 mL Intravenous Q12H   Continuous Infusions: . sodium chloride 75 mL/hr at 10/08/13 0208    Principal Problem:   Mental status, decreased Active Problems:   Heroin overdose   Overdose    Time spent: 25min    [facility] Presbyterian Hospital PlanoJOSEPH,Demondre Aguas  [facility] Pager [phone]. If 7PM-7AM, please contact night-coverage at [email or web address], password [facility] 10/08/2013, 11:55 AM  LOS: 2 days

## 2013-10-08 NOTE — Progress Notes (Signed)
Met with Pt to discuss d/c plans.  Reviewed inpt, outpt and IOP options with Pt.   Provided Pt with [facility]'s IOP information.  Pt to consider all options, but leaning towards IOP.  CSW to meet with Pt tomorrow, as Pt stated that he thinks he'll d/c tomorrow, and offer assistance, if needed.  Bernita Raisin, [place] Work [phone]

## 2013-10-08 NOTE — Progress Notes (Signed)
Patient found in bathroom with  shower running and cigarette ashes in shower. Patient denies smoking but smell of smoke is quite obvious.   Informed patient of dangers of smoking in hospital setting and [facility]'s strict no smoking policy.  2 packs of Marlboro cigarettes where taken from patient room and will be returned prior to discharge.  Patient has agreed to comply with no smoking policy.

## 2013-10-09 MED ORDER — CLONIDINE HCL 0.1 MG PO TABS: 0.1000 mg | ORAL_TABLET | ORAL | Status: AC

## 2013-10-09 NOTE — Progress Notes (Signed)
Pt has decided to d/c to his grandmom's house today [facility]  CSW explained [facility]'s admission process to Pt, highlighting that admission is not granted day of assessment.  Additionally, CSW explained that Pt will have to make an appointment for an assessment, that often takes 1 weeks to obtain.    Pt voiced an understanding and stated that, as long as he's away from the shelter, he'll be ok.  He intends to stay with him mom, too, and stated that this is a supportive environment.  Additionally, his mom lives on the same street as [facility], thus he knows how to access this service, if need be.  CSW thanked Pt for his time.  MD notified.  Pt to be d/c'd.  Providence CrosbyAmanda Shylo Zamor, LCSWA Clinical Social Work [phone]

## 2013-10-10 NOTE — ED Provider Notes (Signed)
Medical screening examination/treatment/procedure(s) were conducted as a shared visit with non-physician practitioner(s) and myself.  I personally evaluated the patient during the encounter.  EKG Interpretation    Date/Time:  Thursday October 06 2013 21:06:13 EST Ventricular Rate:  85 PR Interval:  154 QRS Duration: 87 QT Interval:  378 QTC Calculation: 449 R Axis:   78 Text Interpretation:  Age not entered, assumed to be  23 years old for purpose of ECG interpretation Sinus rhythm ED PHYSICIAN INTERPRETATION AVAILABLE IN [facility] Confirmed by TEST, RECORD ([id number]) on 10/08/2013 1:22:41 PM           Pt with intentional drug od who responded to narcan--once narcan wore off, pt became more lethargic and required an additional dose and was placed on a narcan drip--I had a long talk with his mother who states that she believes this was a suicide attempt--pt re-eval multiple times and airway and mentation stable, will admit to step down  CRITICAL CARE Performed by: Toy BakerALLEN,Robben Jagiello T Total critical care time: 45 Critical care time was exclusive of separately billable procedures and treating other patients. Critical care was necessary to treat or prevent imminent or life-threatening deterioration. Critical care was time spent personally by me on the following activities: development of treatment plan with patient and/or surrogate as well as nursing, discussions with consultants, evaluation of patient's response to treatment, examination of patient, obtaining history from patient or surrogate, ordering and performing treatments and interventions, ordering and review of laboratory studies, ordering and review of radiographic studies, pulse oximetry and re-evaluation of patient's condition.   Toy BakerAnthony T Yojan Paskett, MD 10/10/13 [phone]
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
## 2014-12-20 ENCOUNTER — Encounter (HOSPITAL_COMMUNITY): Payer: Self-pay | Admitting: *Deleted

## 2014-12-20 ENCOUNTER — Emergency Department (HOSPITAL_COMMUNITY)
Admission: EM | Admit: 2014-12-20 | Discharge: 2014-12-20 | Disposition: A | Discharge status: Home / Self Care | Payer: Self-pay | Attending: Emergency Medicine | Admitting: Emergency Medicine

## 2014-12-20 DIAGNOSIS — Z880 Allergy status to penicillin: Secondary | ICD-10-CM | POA: Insufficient documentation

## 2014-12-20 DIAGNOSIS — Z79899 Other long term (current) drug therapy: Secondary | ICD-10-CM | POA: Insufficient documentation

## 2014-12-20 DIAGNOSIS — F319 Bipolar disorder, unspecified: Secondary | ICD-10-CM | POA: Insufficient documentation

## 2014-12-20 DIAGNOSIS — B353 Tinea pedis: Secondary | ICD-10-CM | POA: Insufficient documentation

## 2014-12-20 DIAGNOSIS — Z720 Tobacco use: Secondary | ICD-10-CM | POA: Insufficient documentation

## 2014-12-20 DIAGNOSIS — F419 Anxiety disorder, unspecified: Secondary | ICD-10-CM | POA: Insufficient documentation

## 2014-12-20 MED ORDER — NYSTATIN 100000 UNIT/GM EX POWD
Freq: Once | CUTANEOUS | Status: AC
  Administered 2014-12-20: 12:00:00 via TOPICAL
  Filled 2014-12-20: qty 15

## 2014-12-20 MED ORDER — TERBINAFINE HCL 250 MG PO TABS: 250.0000 mg | ORAL_TABLET | Freq: Every day | ORAL | Status: AC

## 2014-12-20 NOTE — ED Notes (Signed)
Pt reports bilateral foot pain x 1 week, "feels like he is walking on glass" has redness noted to bottom of both feet, denies any wounds or injury.

## 2014-12-20 NOTE — Discharge Instructions (Signed)
Take Terbinafine as directed until gone. Refer to attached documents for more information. Use nystatin cream as directed.

## 2014-12-20 NOTE — ED Notes (Signed)
Declined W/C at D/C and was escorted to lobby by RN. 

## 2014-12-20 NOTE — ED Provider Notes (Signed)
CSN: [id number]     Arrival date & time 12/20/14  1005 History  This chart was scribed for non-physician practitioner, Scherry Ran, working with Blane Ohara, MD by Richarda Overlie, ED Scribe. This patient was seen in room TR05C/TR05C and the patient's care was started at 11:20 AM.  Chief Complaint  Patient presents with  . Foot Pain   The history is provided by the patient. No language interpreter was used.   HPI Comments: Scott Weaver is a 24 y.o. male with a history of bipolar 1 disorder and anxiety who presents to the Emergency Department complaining of worsening bilateral foot pain for the last week. 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 He reports no alleviating factors at this time. Pt reports no pertinent past medical history.    Past Medical History  Diagnosis Date  . Bipolar 1 disorder   . Anxiety    History reviewed. No pertinent past surgical history. History reviewed. No pertinent family history. History  Substance Use Topics  . Smoking status: Current Every Day Smoker -- 0.50 packs/day    Types: Cigarettes  . Smokeless tobacco: Never Used  . Alcohol Use: No    Review of Systems  Skin: Positive for rash.  All other systems reviewed and are negative.     Allergies  Tramadol and Penicillins  Home Medications   Prior to Admission medications   Medication Sig Start Date End Date Taking? Authorizing Provider  busPIRone (BUSPAR) 10 MG tablet Take 10 mg by mouth 2 (two) times daily.     Historical Provider, MD  cloNIDine (CATAPRES) 0.1 MG tablet Take 1 tablet (0.1 mg total) by mouth 2 (two) times daily in the am and at bedtime.. For 2 days then 1 tab daily for 2days then STOP 10/09/13   Zannie Cove, MD  divalproex (DEPAKOTE ER) 500 MG 24 hr tablet Take 500 mg by mouth 2 (two) times daily.     Historical Provider, MD  QUEtiapine (SEROQUEL XR) 300 MG 24 hr tablet Take 300 mg by  mouth at bedtime.    Historical Provider, MD   BP 133/81 mmHg  Pulse 71  Temp(Src) 97.9 F (36.6 C) (Oral)  Resp 18  Ht  (1.702 m)  Wt 155 lb (70.308 kg)  BMI 24.27 kg/m2  SpO2 100% Physical Exam  Constitutional: He is oriented to person, place, and time. He appears well-developed and well-nourished.  HENT:  Head: Normocephalic and atraumatic.  Eyes: Right eye exhibits no discharge. Left eye exhibits no discharge.  Neck: Neck supple. No tracheal deviation present.  Cardiovascular: Normal rate.   Pulmonary/Chest: Effort normal. No respiratory distress.  Abdominal: Soft. He exhibits no distension. There is no tenderness.  Musculoskeletal: Normal range of motion.  Neurological: He is alert and oriented to person, place, and time.  Skin: Skin is warm and dry.  White, tender callous plaques to plantar surfaces of bilateral feet with tenderness to palpation.   Psychiatric: He has a normal mood and affect. His behavior is normal.  Nursing note and vitals reviewed.   ED Course  Procedures   DIAGNOSTIC STUDIES: Oxygen Saturation is 100% on RA, normal by my interpretation.    COORDINATION OF CARE: 11:26 AM Discussed treatment plan with pt at bedside and pt agreed to plan.   Labs Review Labs Reviewed - No data to display  Imaging Review No results found.   EKG Interpretation  None      MDM   Final diagnoses:  Tinea pedis of both feet   Patient will have terbinafine and nystatin powder. Patient advised to keep feet dry.    I personally performed the services described in this documentation, which was scribed in my presence. The recorded information has been reviewed and is accurate.      [name], PA-C 12/20/14 1134  [name], MD 12/20/14 [phone]

## 2015-03-04 ENCOUNTER — Encounter (HOSPITAL_COMMUNITY): Payer: Self-pay

## 2015-03-04 ENCOUNTER — Emergency Department (HOSPITAL_COMMUNITY)
Admission: EM | Admit: 2015-03-04 | Discharge: 2015-03-04 | Disposition: A | Discharge status: Home / Self Care | Payer: No Typology Code available for payment source | Attending: Emergency Medicine | Admitting: Emergency Medicine

## 2015-03-04 ENCOUNTER — Emergency Department (HOSPITAL_COMMUNITY): Payer: No Typology Code available for payment source

## 2015-03-04 DIAGNOSIS — S00511A Abrasion of lip, initial encounter: Secondary | ICD-10-CM | POA: Diagnosis not present

## 2015-03-04 DIAGNOSIS — Z720 Tobacco use: Secondary | ICD-10-CM | POA: Insufficient documentation

## 2015-03-04 DIAGNOSIS — F319 Bipolar disorder, unspecified: Secondary | ICD-10-CM | POA: Insufficient documentation

## 2015-03-04 DIAGNOSIS — S199XXA Unspecified injury of neck, initial encounter: Secondary | ICD-10-CM | POA: Diagnosis not present

## 2015-03-04 DIAGNOSIS — S29012A Strain of muscle and tendon of back wall of thorax, initial encounter: Secondary | ICD-10-CM | POA: Diagnosis not present

## 2015-03-04 DIAGNOSIS — Z79899 Other long term (current) drug therapy: Secondary | ICD-10-CM | POA: Insufficient documentation

## 2015-03-04 DIAGNOSIS — Z880 Allergy status to penicillin: Secondary | ICD-10-CM | POA: Diagnosis not present

## 2015-03-04 DIAGNOSIS — F419 Anxiety disorder, unspecified: Secondary | ICD-10-CM | POA: Diagnosis not present

## 2015-03-04 DIAGNOSIS — Y92410 Unspecified street and highway as the place of occurrence of the external cause: Secondary | ICD-10-CM | POA: Diagnosis not present

## 2015-03-04 DIAGNOSIS — S3992XA Unspecified injury of lower back, initial encounter: Secondary | ICD-10-CM | POA: Diagnosis present

## 2015-03-04 DIAGNOSIS — Y9389 Activity, other specified: Secondary | ICD-10-CM | POA: Diagnosis not present

## 2015-03-04 DIAGNOSIS — S39012A Strain of muscle, fascia and tendon of lower back, initial encounter: Secondary | ICD-10-CM

## 2015-03-04 DIAGNOSIS — Y998 Other external cause status: Secondary | ICD-10-CM | POA: Diagnosis not present

## 2015-03-04 DIAGNOSIS — S0990XA Unspecified injury of head, initial encounter: Secondary | ICD-10-CM | POA: Insufficient documentation

## 2015-03-04 MED ORDER — IBUPROFEN 800 MG PO TABS
800.0000 mg | ORAL_TABLET | Freq: Once | ORAL | Status: AC
  Administered 2015-03-04: 800 mg via ORAL
  Filled 2015-03-04: qty 1

## 2015-03-04 MED ORDER — OXYCODONE-ACETAMINOPHEN 5-325 MG PO TABS: 1.0000 | ORAL_TABLET | Freq: Four times a day (QID) | ORAL | Status: DC | PRN

## 2015-03-04 MED ORDER — HYDROMORPHONE HCL 1 MG/ML IJ SOLN
1.0000 mg | Freq: Once | INTRAMUSCULAR | Status: AC
  Administered 2015-03-04: 1 mg via INTRAVENOUS
  Filled 2015-03-04: qty 1

## 2015-03-04 MED ORDER — SODIUM CHLORIDE 0.9 % IV BOLUS (SEPSIS)
1000.0000 mL | INTRAVENOUS | Status: AC
  Administered 2015-03-04: 1000 mL via INTRAVENOUS
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
## 2015-03-04 NOTE — ED Notes (Signed)
Pt wheeled to family members rooms.
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
## 2015-03-04 NOTE — ED Notes (Signed)
Pt up to ambulate with EMT-P and RN.  Gait steady and even.  Pt able to get to the bathroom and urinate independently.

## 2015-03-23 ENCOUNTER — Emergency Department (HOSPITAL_COMMUNITY)
Admission: EM | Admit: 2015-03-23 | Discharge: 2015-03-23 | Disposition: A | Discharge status: Home / Self Care | Payer: Self-pay | Attending: Emergency Medicine | Admitting: Emergency Medicine

## 2015-03-23 ENCOUNTER — Encounter (HOSPITAL_COMMUNITY): Payer: Self-pay | Admitting: Emergency Medicine

## 2015-03-23 DIAGNOSIS — F319 Bipolar disorder, unspecified: Secondary | ICD-10-CM | POA: Insufficient documentation

## 2015-03-23 DIAGNOSIS — Z720 Tobacco use: Secondary | ICD-10-CM | POA: Insufficient documentation

## 2015-03-23 DIAGNOSIS — Z79899 Other long term (current) drug therapy: Secondary | ICD-10-CM | POA: Insufficient documentation

## 2015-03-23 DIAGNOSIS — K088 Other specified disorders of teeth and supporting structures: Secondary | ICD-10-CM | POA: Insufficient documentation

## 2015-03-23 DIAGNOSIS — K0889 Other specified disorders of teeth and supporting structures: Secondary | ICD-10-CM

## 2015-03-23 DIAGNOSIS — Z880 Allergy status to penicillin: Secondary | ICD-10-CM | POA: Insufficient documentation

## 2015-03-23 DIAGNOSIS — F419 Anxiety disorder, unspecified: Secondary | ICD-10-CM | POA: Insufficient documentation

## 2015-03-23 MED ORDER — IBUPROFEN 600 MG PO TABS
600.0000 mg | ORAL_TABLET | Freq: Four times a day (QID) | ORAL | Status: DC | PRN
Start: 2015-03-23 — End: 2016-01-28

## 2015-03-23 NOTE — Discharge Instructions (Signed)
Dental Pain °A tooth ache may be caused by cavities (tooth decay). Cavities expose the nerve of the tooth to air and hot or cold temperatures. It may come from an infection or abscess (also called a boil or furuncle) around your tooth. It is also often caused by dental caries (tooth decay). This causes the pain you are having. °DIAGNOSIS  °Your caregiver can diagnose this problem by exam. °TREATMENT  °· If caused by an infection, it may be treated with medications which kill germs (antibiotics) and pain medications as prescribed by your caregiver. Take medications as directed. °· Only take over-the-counter or prescription medicines for pain, discomfort, or fever as directed by your caregiver. °· Whether the tooth ache today is caused by infection or dental disease, you should see your dentist as soon as possible for further care. °SEEK MEDICAL CARE IF: °The exam and treatment you received today has been provided on an emergency basis only. This is not a substitute for complete medical or dental care. If your problem worsens or new problems (symptoms) appear, and you are unable to meet with your dentist, call or return to this location. °SEEK IMMEDIATE MEDICAL CARE IF:  °· You have a fever. °· You develop redness and swelling of your face, jaw, or neck. °· You are unable to open your mouth. °· You have severe pain uncontrolled by pain medicine. °MAKE SURE YOU:  °· Understand these instructions. °· Will watch your condition. °· Will get help right away if you are not doing well or get worse. °Document Released: 09/08/2005 Document Revised: 12/01/2011 Document Reviewed: 04/26/2008 °ExitCare® Patient Information ©2015 [employer]. This information is not intended to replace advice given to you by your health care provider. Make sure you discuss any questions you have with your health care provider. ° °Emergency Department Resource Guide °1) Find a Doctor and Pay Out of Pocket °Although you won't have to find out who  is covered by your insurance plan, it is a good idea to ask around and get recommendations. You will then need to call the office and see if the doctor you have chosen will accept you as a new patient and what types of options they offer for patients who are self-pay. Some doctors offer discounts or will set up payment plans for their patients who do not have insurance, but you will need to ask so you aren't surprised when you get to your appointment. ° °2) Contact Your Local Health Department °Not all health departments have doctors that can see patients for sick visits, but many do, so it is worth a call to see if yours does. If you don't know where your local health department is, you can check in your phone book. The CDC also has a tool to help you locate your state's health department, and many state websites also have listings of all of their local health departments. ° °3) Find a Walk-in Clinic °If your illness is not likely to be very severe or complicated, you may want to try a walk in clinic. These are popping up all over the country in pharmacies, drugstores, and shopping centers. They're usually staffed by nurse practitioners or physician assistants that have been trained to treat common illnesses and complaints. They're usually fairly quick and inexpensive. However, if you have serious medical issues or chronic medical problems, these are probably not your best option. ° °No Primary Care Doctor: °- Call [facility] at  832-8000 - they can help you locate a primary   care doctor that  accepts your insurance, provides certain services, etc. °- Physician Referral Service- 1-800-533-3463 ° °Chronic Pain Problems: °Organization         Address  Phone   Notes  °[facility]  (336) 297-2271 Patients need to be referred by their primary care doctor.  ° °Medication Assistance: °Organization         Address  Phone   Notes  °[place] [facility] [address]  [address] °[place], [place] [address] (336) 641-8030 --Must be a resident of [place] °-- Must have NO insurance coverage whatsoever (no Medicaid/ Medicare, etc.) °-- The pt. MUST have a primary care doctor that directs their care regularly and follows them in the community °  °[facility]  (866) 331-1348   °[employer]  (888) 892-1162   ° °Agencies that provide inexpensive medical care: °Organization         Address  Phone   Notes  °[facility]  (336) 832-8035   °[facility]    (336) 832-7272   °[facility] [address] °[place], [place] [address] (336) 832-4777   °[facility] [address], °[place] (336) 271-4999   °[facility]    (336) 373-0678   °[facility]    (336) 272-1050   °[facility] ° [address], [place] Phone:  (336) 832-4444, Fax:  (336) 832-4440 Hours of Operation:  9 am - 6 pm, M-F.  Also accepts Medicaid/Medicare and self-pay.  °[facility] ° [address], [place] Phone: (336) 832-3150, Fax: (336) [phone]. Hours of Operation:  8:30 am - 5:30 pm, M-F.  Also accepts Medicaid and self-pay.  °[facility] [place] [address], [place] Phone: (336) 878-6027   °[facility] [address], [place], [place] (336)723-1848, Ext. 123 Mondays & Thursdays: 7-9 AM.  First 15 patients are seen on a first come, first serve basis. °  ° °Medicaid-accepting [place] Providers: ° °Organization         Address  Phone   Notes  °[facility] [address], [place] (336) 641-2100 Also accepts self-pay patients.  °[facility] [address], [place] ° (336) 856-9996   °[facility] [address], [place] (336) 288-8857   °[facility] [address] [place] [address], [place] (336) 299-7000   °Veita Bland [address], [place]  ° (336) 373-1557 Only accepts [place] Access Medicaid patients after they have their name applied to their card.  ° °Self-Pay (no insurance) in [place]: ° °Organization         Address  Phone   Notes  °Sickle Cell Patients, [facility] [address], [place] (336) 832-1970   °[facility] [address], [place] (336) 832-4400   °[facility] ° [address], [place] (336) 992-4800   °[facility]/Dr. Osei-Bonsu ° [address] [place] [address], [place] or [address], [place] (336) 841-8500 Phone number for both [place] and [place] locations is the same.  °[facility] [address], [place] (336) 299-0000   °[facility] [address] [place] [address], [place] or [address] (336) 852-7530 °(336) 878-2260   °  [facility] [address], [place] (336) 350-1642, phone; (336) 294-5005, fax Sees patients 1st and 3rd Saturday of every month.  Must not qualify for public or private insurance (i.e. Medicaid, Medicare, [place] Health Choice, Veterans' Benefits) • Household income should be no more than 200% of the poverty level •The clinic cannot treat you if you are pregnant or think you are pregnant • Sexually transmitted diseases are not treated at the clinic.  ° ° °Dental Care: °Organization         Address  Phone  Notes  °[place] [facility] [address], [employer] (336) 641-6152 Accepts children up to age 21 who are enrolled in Medicaid or [place] Health Choice; pregnant women with a Medicaid card; and children who have applied for Medicaid or [place] Health Choice, but were declined, whose parents can pay a reduced fee at time of service.  °[facility] [place]  [address], [place] (336) 641-7733 Accepts children up to age 21 who are  enrolled in Medicaid or [place] Health Choice; pregnant women with a Medicaid card; and children who have applied for Medicaid or [place] Health Choice, but were declined, whose parents can pay a reduced fee at time of service.  °[facility] ° [address], [place] (336) 641-4533 Patients are seen by appointment only. Walk-ins are not accepted. [facility] will see patients 18 years of age and older. °Monday - Tuesday (8am-5pm) °Most Wednesdays (8:30-5pm) °$30 per visit, cash only  °[facility] ° [address], [place] (336) 641-4533 Patients are seen by appointment only. Walk-ins are not accepted. [facility] will see patients 18 years of age and older. °One Wednesday Evening (Monthly: Volunteer Based).  $30 per visit, cash only  °[facility]  (919) 537-3737 for adults; Children under age 4, call Graduate Pediatric Dentistry at ([phone]) [phone]. Children aged 4-14, please call (919) [phone] to request a pediatric application. ° Dental services are provided in all areas of dental care including fillings, crowns and bridges, complete and partial dentures, implants, gum treatment, root canals, and extractions. Preventive care is also provided. Treatment is provided to both adults and children. °Patients are selected via a lottery and there is often a waiting list. °  °[facility] [address], °[place] ° (336) 763-8833 [email or web address] °  °[facility] [address], [place], [place] (336)723-1848, Ext. 123 Second and Fourth Thursday of each month, opens at 6:30 AM; Clinic ends at 9 AM.  Patients are seen on a first-come first-served basis, and a limited number are seen during each clinic.  ° °[facility] ° [address], [place],  (336) 723-7904   Eligibility Requirements °You must have lived in [place], [place], or [place] counties for at least the last three months. °  You  cannot be eligible for state or federal sponsored healthcare insurance, including Veterans Administration, Medicaid, or Medicare. °  You generally cannot be eligible for healthcare insurance through your employer.  °  How to apply: °Eligibility screenings are held every Tuesday and Wednesday afternoon from 1:00 pm until 4:00 pm. You do not need an appointment for the interview!  °[facility] [address], [place],  336-631-2330   °[facility]  336-342-8273   °[facility]  336-703-3100   °[facility]  336-570-6415   ° °Behavioral Health Resources in the Community: °Intensive Outpatient Programs °Organization         Address  Phone  Notes  °[facility] [address], [place], [place] 336-878-6098   °[facility] [address], [place], [place] 336-832-9800   °[facility] [address], [place], [place] ° 336-882-2125   °[facility] [address],  °[place], [place] 1-800-853-5163 or 336-641-4981   °Substance Abuse Resources °Organization         Address  Phone  Notes  °[facility]  336-882-2125   °[facility]  336-784-9470   °[facility]  336-285-9073   °[facility]  336-845-3988   °Residential & Outpatient Substance Abuse Program  1-800-659-3381   °Psychological Services °Organization         Address  Phone  Notes  °[facility]  336- 832-9600   °[facility]  336- 378-7881   °[facility] [address], [place] 1-800-853-5163 or 336-641-4981   ° °Mobile Crisis Teams °Organization         Address  Phone  Notes  °[facility], Mobile Crisis Care Unit  1-877-626-1772   °Assertive °[facility] ° [address] [place], [place] 336-834-9664   °Sharon DeEsch [address] °[place] 336-554-5454   ° °Self-Help/Support  Groups °Organization         Address  Phone             Notes  °[facility] - variety of support groups  336- 373-1402 Call for more information  °Narcotics Anonymous (NA), [facility] [address], °[place]  2 meetings at this location  ° °Residential Treatment Programs °Organization         Address  Phone  Notes  °[facility] [address],    °[place]  1-866-801-8205   °[facility] ° [address], [place], [place] 704-293-8524   °[facility] [address], [place] 336-845-3988 Admissions: 8am-3pm M-F  °[facility] [address]    °[place], [place] 336-841-1104   °[facility] [address], [place], [place] 336-379-7146   °[facility] [address]  °[place], [place] 336-285-9073   °[facility] Intensive Outpatient [address], [place], [place] 336-852-3033   °[facility] [address]  °[place], [place] 1-877-615-2722 or 336-784-9470   °[facility] [address] [place], [place] 336-227-7417 Accepts Medicaid  °[facility] [address]  °[place] 1-800-659-3381 Substance Abuse/Addiction Treatment  ° °[place] Behavioral Health Resources °Organization         Address  Phone  Notes  °[facility]  (888) 581-9988   °Julie Brannon, PhD [address] [place], [place]   (336) 349-5553 or (336) 951-0000   °[facility]   [address] °[place], [place] (336) 349-4454   °[facility] [address], [place], [place] (336) 342-8316 Insurance/Medicaid/sponsorship through [facility]  °[facility] [address]                                    [place],  (336) 342-8316 Therapy/tele-psych/case  °[facility]   [address]  ° [place], [place] (336) 349-2233    °Dr. Arfeen  (336) 349-4544   °[facility] [place]  [employer] [facility] [place] [facility] 1) [address], [place] °2) [address], [place] °3)  [address], [place] (336) 349-3220 °(336) 342-7768 ° °(336) 342-8140   °[place] [facility] (336) 342-1394 or (336) 342-3537 (After Hours)    ° ° ° °

## 2015-03-23 NOTE — ED Notes (Signed)
Pt. reports left upper molar pain for several months .

## 2015-03-23 NOTE — ED Provider Notes (Signed)
CSN: [id number]     Arrival date & time 03/23/15  2003 History   This chart was scribed for non-physician practitioner, Eyvonne Mechanic, PA-C, working with No att. providers found by Bethel Born, ED Scribe. This patient was seen in room TR07C/TR07C and the patient's care was started at 8:36 PM.  Chief Complaint  Patient presents with  . Dental Pain     Patient is a 24 y.o. male presenting with tooth pain. The history is provided by the patient. No language interpreter was used.  Dental Pain  Scott Weaver is a 24 y.o. male who presents to the Emergency Department complaining of constant upper left dental pain with onset 3 days ago. The pain is rated 10/10 in severity. 2 tablets of ibuprofen QID provided insufficient pain relief PTA.  Pt states that there is a "pus pocket" on the tooth that drains a foul tasting liquid when he bites down. Associated symptoms include nausea. Pt denies fever, chills,and vomiting. He has a scheduled dentist appointment in [place] on 04/08/15.   Past Medical History  Diagnosis Date  . Bipolar 1 disorder   . Anxiety    History reviewed. No pertinent past surgical history. No family history on file. History  Substance Use Topics  . Smoking status: Current Every Day Smoker -- 0.50 packs/day    Types: Cigarettes  . Smokeless tobacco: Never Used  . Alcohol Use: No    Review of Systems  All other systems reviewed and are negative.   Allergies  Tramadol and Penicillins  Home Medications   Prior to Admission medications   Medication Sig Start Date End Date Taking? Authorizing Provider  busPIRone (BUSPAR) 10 MG tablet Take 10 mg by mouth 2 (two) times daily.     Historical Provider, MD  cloNIDine (CATAPRES) 0.1 MG tablet Take 1 tablet (0.1 mg total) by mouth 2 (two) times daily in the am and at bedtime.. For 2 days then 1 tab daily for 2days then STOP 10/09/13   Zannie Cove, MD  divalproex (DEPAKOTE ER) 500 MG 24 hr tablet Take 500 mg by mouth  2 (two) times daily.     Historical Provider, MD  ibuprofen (ADVIL,MOTRIN) 600 MG tablet Take 1 tablet (600 mg total) by mouth every 6 (six) hours as needed. 03/23/15   Eyvonne Mechanic, PA-C  oxyCODONE-acetaminophen (PERCOCET) 5-325 MG per tablet Take 1 tablet by mouth every 6 (six) hours as needed for moderate pain. 03/04/15   Purvis Sheffield, MD  QUEtiapine (SEROQUEL XR) 300 MG 24 hr tablet Take 300 mg by mouth at bedtime.    Historical Provider, MD  terbinafine (LAMISIL) 250 MG tablet Take 1 tablet (250 mg total) by mouth daily. 12/20/14   Emilia Beck, PA-C   Triage Vitals: BP 147/78 mmHg  Pulse 104  Temp(Src) 98.3 F (36.8 C) (Oral)  Resp 18  Ht  (1.651 m)  Wt 143 lb (64.864 kg)  BMI 23.80 kg/m2  SpO2 99%  Physical Exam  Constitutional: He is oriented to person, place, and time. He appears well-developed and well-nourished. No distress.  HENT:  Head: Normocephalic and atraumatic.  Mouth/Throat: Uvula is midline and mucous membranes are normal. No oropharyngeal exudate, posterior oropharyngeal edema or posterior oropharyngeal erythema.  Missing multiple teeth from both top and bottom, gumline normal no infection, no abscess or fluctuance Roof of mouth normal Floor of mouth normal and soft Tongue normal with normal ROM Face symmetric,no obvious signs of infection, no soft tissue swelling, no fluctuance felt  with external palpation  Eyes: Conjunctivae and EOM are normal.  Neck: Normal range of motion. Neck supple. No tracheal deviation present.  Cardiovascular: Normal rate.   Pulmonary/Chest: Effort normal. No respiratory distress.  Musculoskeletal: Normal range of motion.  Neurological: He is alert and oriented to person, place, and time.  Skin: Skin is warm and dry.  Psychiatric: He has a normal mood and affect. His behavior is normal.  Nursing note and vitals reviewed.   ED Course  Procedures   Dental Block Performed ZO:XWRUEAVby:Mikita Lesmeister, PA-C Consent: Verbal consent  obtained Required items: required blood products, implants, devices, and special equipment available. Time out: Immediately prior to the procedure a "time out" was called to verify the correct patient, procedure, equipment, support staff, and site/side marked as required. Indication:dental pain Nerve block body site:upper left buccal nerve Needle gauge:27 Location technique: anatomical landmarks Local anesthetic:bupivicane Anesthetic total: 1.8 ml Outcome:pain improvement Patient tolerance: Patient tolerated the procedure well with no immediate complications.    DIAGNOSTIC STUDIES: Oxygen Saturation is 99% on RA, normal by my interpretation.    COORDINATION OF CARE: 8:37 Discussed treatment plan which includes dental block and all associated risk of infection with pt at bedside and pt agreed to plan.  9:10 PM Re-evaluted the pt, his pain is significantly improved after the dental block. I advised him to f/u with the provided resources.   Labs Review Labs Reviewed - No data to display  Imaging Review No results found.   EKG Interpretation None      MDM   Final diagnoses:  Pain, dental    Labs  Imaging:   Consults   Therapeutics: Dental block  Assessment:  Plan: Patient presents with uncomplicated dental pain. No signs of infection. He was given a dental block that completely eliminated his dental pain. Patient alert he has a scheduled appointment with a dentist, he was given resource information for other dentist to contact for sooner appointment. He is instructed use Tylenol and ibuprofen as needed for the pain. Monitor for new or worsening signs or symptoms return as needed. Patient verbalizes understanding and agreement for today's plan.    I personally performed the services described in this documentation, which was scribed in my presence. The recorded information has been reviewed and is accurate.   Eyvonne MechanicJeffrey Jourdin Gens, PA-C 03/23/15 2147  Blake DivineJohn Wofford,  MD 03/24/15 [phone]

## 2015-05-21 ENCOUNTER — Encounter (HOSPITAL_COMMUNITY): Payer: Self-pay | Admitting: Vascular Surgery

## 2015-05-21 ENCOUNTER — Emergency Department (HOSPITAL_COMMUNITY)
Admission: EM | Admit: 2015-05-21 | Discharge: 2015-05-21 | Disposition: A | Discharge status: Home / Self Care | Payer: Self-pay | Attending: Emergency Medicine | Admitting: Emergency Medicine

## 2015-05-21 DIAGNOSIS — F319 Bipolar disorder, unspecified: Secondary | ICD-10-CM | POA: Insufficient documentation

## 2015-05-21 DIAGNOSIS — Z880 Allergy status to penicillin: Secondary | ICD-10-CM | POA: Insufficient documentation

## 2015-05-21 DIAGNOSIS — Z720 Tobacco use: Secondary | ICD-10-CM | POA: Insufficient documentation

## 2015-05-21 DIAGNOSIS — F419 Anxiety disorder, unspecified: Secondary | ICD-10-CM | POA: Insufficient documentation

## 2015-05-21 DIAGNOSIS — J329 Chronic sinusitis, unspecified: Secondary | ICD-10-CM | POA: Insufficient documentation

## 2015-05-21 DIAGNOSIS — Z79899 Other long term (current) drug therapy: Secondary | ICD-10-CM | POA: Insufficient documentation

## 2015-05-21 DIAGNOSIS — H9209 Otalgia, unspecified ear: Secondary | ICD-10-CM | POA: Insufficient documentation

## 2015-05-21 MED ORDER — ACETAMINOPHEN 500 MG PO TABS
500.0000 mg | ORAL_TABLET | Freq: Once | ORAL | Status: AC
  Administered 2015-05-21: 500 mg via ORAL
  Filled 2015-05-21: qty 1

## 2015-05-21 MED ORDER — DOXYCYCLINE HYCLATE 100 MG PO CAPS: 100.0000 mg | ORAL_CAPSULE | Freq: Two times a day (BID) | ORAL | Status: AC

## 2015-05-21 MED ORDER — ACETAMINOPHEN 500 MG PO TABS: 500.0000 mg | ORAL_TABLET | Freq: Four times a day (QID) | ORAL | Status: AC | PRN

## 2015-05-21 NOTE — ED Notes (Signed)
Pt reports to the ED for eval of facial pain to his maxillary sinuses. He also reports nasal congestion, sore throat, and a productive cough. Denies any fevers or N/V/D. Pt A&Ox4, resp e/u, and skin warm and dry.

## 2015-05-21 NOTE — ED Provider Notes (Signed)
CSN: [id number]     Arrival date & time 05/21/15  1845 History   First MD Initiated Contact with Patient 05/21/15 2041     Chief Complaint  Patient presents with  . Facial Pain    HPI   Scott Weaver is a 24 y.o. male with a PMH of bipolar 1, anxiety who presents to the ED with cough, nasal congestion, R sided facial pain, and R sided headache since Friday. He reports his cough has been productive of yellow-green sputum. He reports his facial pain and headache are constant. He is unable to identify exacerbating factors. He reports he has tried alka-seltzer cold medicine for symptom relief, which has not helped. He denies fever, but states he has had "cold chills." He denies lightheadedness, dizziness, visual changes, syncope, chest pain, shortness of breath, abdominal pain, N/V/D, weakness. He reports he has felt more fatigued since the onset of his symptoms. Denies sick contact or recent illness.   Past Medical History  Diagnosis Date  . Bipolar 1 disorder   . Anxiety    History reviewed. No pertinent past surgical history. No family history on file. Social History  Substance Use Topics  . Smoking status: Current Every Day Smoker -- 0.50 packs/day    Types: Cigarettes  . Smokeless tobacco: Never Used  . Alcohol Use: No     Review of Systems  Constitutional: Positive for chills and fatigue. Negative for fever, activity change and appetite change.  HENT: Positive for congestion, ear pain and sinus pressure. Negative for ear discharge and sore throat.   Eyes: Negative for visual disturbance.  Respiratory: Positive for cough. Negative for shortness of breath.   Cardiovascular: Negative for chest pain, palpitations and leg swelling.  Gastrointestinal: Negative for nausea, vomiting, abdominal pain, diarrhea, constipation and abdominal distention.  Genitourinary: Negative for dysuria, urgency and frequency.  Musculoskeletal: Negative for myalgias, back pain, arthralgias, neck pain  and neck stiffness.  Skin: Negative for color change, pallor, rash and wound.  Neurological: Negative for dizziness, syncope, weakness, light-headedness, numbness and headaches.  All other systems reviewed and are negative.     Allergies  Tramadol and Penicillins  Home Medications   Prior to Admission medications   Medication Sig Start Date End Date Taking? Authorizing Provider  acetaminophen (TYLENOL) 500 MG tablet Take 1 tablet (500 mg total) by mouth every 6 (six) hours as needed. 05/21/15   Mady Gemma, PA-C  busPIRone (BUSPAR) 10 MG tablet Take 10 mg by mouth 2 (two) times daily.     Historical Provider, MD  cloNIDine (CATAPRES) 0.1 MG tablet Take 1 tablet (0.1 mg total) by mouth 2 (two) times daily in the am and at bedtime.. For 2 days then 1 tab daily for 2days then STOP 10/09/13   Zannie Cove, MD  divalproex (DEPAKOTE ER) 500 MG 24 hr tablet Take 500 mg by mouth 2 (two) times daily.     Historical Provider, MD  doxycycline (VIBRAMYCIN) 100 MG capsule Take 1 capsule (100 mg total) by mouth 2 (two) times daily. 05/21/15   Mady Gemma, PA-C  ibuprofen (ADVIL,MOTRIN) 600 MG tablet Take 1 tablet (600 mg total) by mouth every 6 (six) hours as needed. 03/23/15   Eyvonne Mechanic, PA-C  oxyCODONE-acetaminophen (PERCOCET) 5-325 MG per tablet Take 1 tablet by mouth every 6 (six) hours as needed for moderate pain. 03/04/15   Purvis Sheffield, MD  QUEtiapine (SEROQUEL XR) 300 MG 24 hr tablet Take 300 mg by mouth at bedtime.  Historical Provider, MD  terbinafine (LAMISIL) 250 MG tablet Take 1 tablet (250 mg total) by mouth daily. 12/20/14   Kaitlyn Szekalski, PA-C    BP 124/61 mmHg  Pulse 58  Temp(Src) 98.4 F (36.9 C) (Oral)  Resp 16  SpO2 98% Physical Exam  Constitutional: He is oriented to person, place, and time. He appears well-developed and well-nourished. No distress.  HENT:  Head: Normocephalic and atraumatic.  Right Ear: Tympanic membrane and ear canal normal.  There is tenderness. No drainage.  Left Ear: Tympanic membrane, external ear and ear canal normal. No drainage or tenderness.  Nose: No mucosal edema. Right sinus exhibits maxillary sinus tenderness and frontal sinus tenderness. Left sinus exhibits no maxillary sinus tenderness and no frontal sinus tenderness.  Mouth/Throat: Uvula is midline, oropharynx is clear and moist and mucous membranes are normal. No oropharyngeal exudate, posterior oropharyngeal edema or posterior oropharyngeal erythema.  TTP of right forehead. Mild TTP of right ear. TTP of right frontal and maxillary sinuses.  Eyes: Conjunctivae, EOM and lids are normal. Pupils are equal, round, and reactive to light. Right eye exhibits no discharge. Left eye exhibits no discharge. No scleral icterus.  Neck: Normal range of motion. Neck supple.  Cardiovascular: Normal rate, regular rhythm, normal heart sounds, intact distal pulses and normal pulses.   Pulmonary/Chest: Effort normal and breath sounds normal. No respiratory distress. He has no wheezes. He has no rales.  Abdominal: Soft. Normal appearance and bowel sounds are normal. He exhibits no distension and no mass. There is no tenderness. There is no rigidity, no rebound and no guarding.  Musculoskeletal: Normal range of motion. He exhibits no edema or tenderness.  Lymphadenopathy:    He has no cervical adenopathy.  Neurological: He is alert and oriented to person, place, and time. He has normal strength. No cranial nerve deficit or sensory deficit.  Skin: Skin is warm, dry and intact. No rash noted. He is not diaphoretic. No erythema. No pallor.  Psychiatric: He has a normal mood and affect. His speech is normal and behavior is normal. Judgment and thought content normal.  Nursing note and vitals reviewed.   ED Course  Procedures (including critical care time)  Labs Review Labs Reviewed - No data to display  Imaging Review No results found.     EKG Interpretation None       MDM   Final diagnoses:  Rhinosinusitis   24 year old male presents with cough, nasal congestion, R sided facial pain, and R sided headache since Friday. He denies fever, but states he has had "cold chills." He denies lightheadedness, dizziness, vision changes, syncope, chest pain, shortness of breath, abdominal pain, N/V/D, weakness. He reports he has felt more fatigued since the onset of his symptoms. Denies sick contact or recent illness.  Patient is afebrile. Vital signs stable. O2 sat 98% on RA. Right forehead, ear, and frontal and maxillary sinuses tender to palpation. TMs clear bilaterally. Lungs clear to auscultation bilaterally. Normal neurological exam, no focal neuro deficit.  Headache treated with tylenol in the ED with subsequent improvement. Symptoms most likely consistent with rhinosinusitis. Will discharge with doxycyline x 7 days given severe facial pain since Friday. Patient to follow-up with PCP. Return precautions discussed.   BP 124/61 mmHg  Pulse 58  Temp(Src) 98.4 F (36.9 C) (Oral)  Resp 16  SpO2 98%         Mady Gemma, PA-C 05/21/15 2254  Richardean Canal, MD 05/22/15 [phone]

## 2015-05-21 NOTE — Discharge Instructions (Signed)
1. Medications: tylenol, doxycycline, usual home medications 2. Treatment: rest, drink plenty of fluids 3. Follow Up: please followup with your primary doctor this week for discussion of your diagnoses and further evaluation after today's visit; if you do not have a primary care doctor use the resource guide provided to find one; please return to the ER for severe pain, chest pain, shortness of breath, fever, new or worsening symptoms   Sinusitis Sinusitis is redness, soreness, and puffiness (inflammation) of the air pockets in the bones of your face (sinuses). The redness, soreness, and puffiness can cause air and mucus to get trapped in your sinuses. This can allow germs to grow and cause an infection.  HOME CARE   Drink enough fluids to keep your pee (urine) clear or pale yellow.  Use a humidifier in your home.  Run a hot shower to create steam in the bathroom. Sit in the bathroom with the door closed. Breathe in the steam 3-4 times a day.  Put a warm, moist washcloth on your face 3-4 times a day, or as told by your doctor.  Use salt water sprays (saline sprays) to wet the thick fluid in your nose. This can help the sinuses drain.  Only take medicine as told by your doctor. GET HELP RIGHT AWAY IF:   Your pain gets worse.  You have very bad headaches.  You are sick to your stomach (nauseous).  You throw up (vomit).  You are very sleepy (drowsy) all the time.  Your face is puffy (swollen).  Your vision changes.  You have a stiff neck.  You have trouble breathing. MAKE SURE YOU:   Understand these instructions.  Will watch your condition.  Will get help right away if you are not doing well or get worse. Document Released: 02/25/2008 Document Revised: 06/02/2012 Document Reviewed: 04/13/2012 [facility] Patient Information 2015 [place], [place]. This information is not intended to replace advice given to you by your health care provider. Make sure you discuss any questions  you have with your health care provider.   Emergency Department Resource Guide 1) Find a Doctor and Pay Out of Pocket Although you won't have to find out who is covered by your insurance plan, it is a good idea to ask around and get recommendations. You will then need to call the office and see if the doctor you have chosen will accept you as a new patient and what types of options they offer for patients who are self-pay. Some doctors offer discounts or will set up payment plans for their patients who do not have insurance, but you will need to ask so you aren't surprised when you get to your appointment.  2) Contact Your Local Health Department Not all health departments have doctors that can see patients for sick visits, but many do, so it is worth a call to see if yours does. If you don't know where your local health department is, you can check in your phone book. The CDC also has a tool to help you locate your state's health department, and many state websites also have listings of all of their local health departments.  3) Find a Walk-in Clinic If your illness is not likely to be very severe or complicated, you may want to try a walk in clinic. These are popping up all over the country in pharmacies, drugstores, and shopping centers. They're usually staffed by nurse practitioners or physician assistants that have been trained to treat common illnesses and complaints. They're  usually fairly quick and inexpensive. However, if you have serious medical issues or chronic medical problems, these are probably not your best option.  No Primary Care Doctor: - Call Health Connect at  [phone] - they can help you locate a primary care doctor that  accepts your insurance, provides certain services, etc. - Physician Referral Service- [phone]  Chronic Pain Problems: Organization         Address  Phone   Notes  [facility]  [phone] Patients need to be referred by their  primary care doctor.   Medication Assistance: Organization         Address  Phone   Notes  [facility] Medication [facility] [address] [place] [place], [place] [address] [phone] --Must be a resident of [place] -- Must have NO insurance coverage whatsoever (no Medicaid/ Medicare, etc.) -- The pt. MUST have a primary care doctor that directs their care regularly and follows them in the community   [facility]ist  [phone]   [employer]  [phone]    Agencies that provide inexpensive medical care: Organization         Address  Phone   Notes  [facility]  [phone]   [facility]    [phone]   [facility] [address] [place], [place] [address] [phone]   [facility] [address], [place] [phone]   Planned Parenthood    [phone]   [facility]    [phone]   [facility] and [facility]  [address], [place] Phone:  [phone], Fax:  [phone] Hours of Operation:  9 am - 6 pm, M-F.  Also accepts Medicaid/Medicare and self-pay.  [facility]  [address], [place] Phone: [phone], Fax: [phone]. Hours of Operation:  8:30 am - 5:30 pm, M-F.  Also accepts Medicaid and self-pay.  [facility] [place] [address], [place] Phone: [phone]   [facility] [address] Natasha Bence [place], [place] [phone], Ext. [phone] Mondays & Thursdays: 7-9 AM.  First 15 patients are seen on a first come, first serve basis.    Medicaid-accepting [facility] Providers:  Organization         Address  Phone   Notes  [facility] [address], [place] [phone] Also accepts self-pay patients.  [facility] [address] Laurell Josephs [place], [place]  [phone]   [facility] [address], [place] [phone]   [facility] [address], [place] [phone]   Renaye Rakers [address], [place]   [phone] Only accepts [place] patients after they have their name applied to their card.   Self-Pay (no insurance) in [facility]:  Organization         Address  Phone   Notes  Sickle Cell Patients, [facility] Internal Medicine [address] [place], [place] [phone]   [facility]gent Care [address] [place], [place] [phone]   [facility] [place]  [address],  [phone]   [facility]/Dr. Osei-Bonsu  [address] [place] or [address], [place] [phone]  Phone number for both [employer] and [place] locations is the same.  Urgent Medical and [facility] [address] [place] [phone]   [facility] [address] [place] or [address] [phone]   [facility] [address] [place] [phone], phone; [phone], fax Sees patients 1st and 3rd Saturday of every month.  Must not qualify for public or private insurance (i.e. Medicaid, Medicare, [place] Health Choice, Veterans' Benefits)  Household income should be no more than 200% of the poverty level The clinic cannot treat you if you are pregnant or think you are pregnant  Sexually transmitted diseases are not treated at the clinic.    Dental Care: Organization         Address  Phone  Notes  [facility] Department of [facility] [address] [place], [place] [phone] Accepts children up to age 81 who are enrolled in [place] or [place] Health Choice; pregnant women with a Medicaid card; and children who have applied for Medicaid or [place] Health Choice, but were declined, whose parents can pay a reduced fee  at time of service.  [facility]  [address], [place] [phone] Accepts children up to age 34 who are enrolled in [place] or [place] Health Choice; pregnant women with a Medicaid card; and children who have applied for Medicaid or [place] Health Choice, but were declined, whose parents can pay a reduced fee at time of service.  [facility]  [address] [place], [place] [phone] Patients are seen by appointment only. Walk-ins are not accepted. [facility] will see patients 61 years of age and older. Monday - Tuesday (8am-5pm) Most Wednesdays (8:30-5pm) $30 per visit, cash only  [facility]  [address], [facility] [phone] Patients are seen by appointment only. Walk-ins are not accepted. [facility] will see patients 68 years of age and older. One Wednesday Evening (Monthly: Volunteer Based).  $30 per visit, cash only  [employer]  [phone] for adults; Children under age 14, call Graduate Pediatric Dentistry at [phone]. Children aged 32-14, please call [phone] to request a pediatric application.  Dental services are provided in all areas of dental care including fillings, crowns and bridges, complete and partial dentures, implants, gum treatment, root canals, and extractions. Preventive care is also provided. Treatment is provided to both adults and children. Patients are selected via a lottery and there is often a waiting list.   [facility] [address], [place]  [phone] [email or web address]   [facility] [address] [place], [place] [phone], Ext. [phone] Second and Fourth Thursday of each month, opens at 6:30 AM; Clinic ends at 9 AM.  Patients are seen on a first-come first-served basis, and a limited number are seen during each clinic.   [facility]  [address] Ether Griffins [place], [place] [phone]   Eligibility Requirements You must have lived in [place], [place], or [place] counties for at least the last three months.   You cannot be eligible for state or federal sponsored [place], including [employer], [place], or [employer].   You generally cannot be eligible for healthcare insurance through your employer.    How to apply: Eligibility screenings are held every Tuesday and Wednesday afternoon from 1:00  pm until 4:00 pm. You do not need an appointment for the interview!  [facility] [address], [place], [place] [phone]   [facility] Health Department  [phone]   [facility] Health Department  [phone]   [facility] Health Department  [phone]    Behavioral Health Resources in the Community: Intensive Outpatient Programs Organization         Address  Phone  Notes  [facility] [address], [place], [place] [phone]   [facility] [address], [place], [place] [phone]   [facility] [address], [place], [place]  [phone]   [facility] Mental Health [address],  [place], [place] [phone] or [phone]   Substance Abuse Resources Organization         Address  Phone  Notes  [facility]  [phone]   [facility]  [phone]   [facility]  [phone]   Floydene Flock  [phone]   Residential & Outpatient Substance Abuse Program  [phone]   Psychological Services Organization         Address  Phone  Notes  [facility] Behavioral Health  [phone]   [facility]  [phone]   [facility] Mental Health [address] [place] [phone] or [phone]    Mobile Crisis Teams Organization         Address  Phone  Notes  [facility], Mobile Crisis Care Unit  [phone]   [facility]  [facility]  [address] [place], [place] [phone]   Doristine Locks [address] [place] [phone]    Self-Help/Support Groups Organization         Address  Phone             Notes  [facility] [place] - variety of support groups  336- I7437963 Call for more information  Narcotics Anonymous (NA), [facility] [address], [employer] [place]  2 meetings at this location   Statistician         Address  Phone  Notes  [facility] [address],    [place]  [phone]   [facility]  [address] [place] [address], [place], [place] [phone]   [facility] [address] [place], [place] [phone] Admissions: 8am-3pm M-F  [facility] [address]    [place], [place] [phone]   [facility] [address] [place], [place], [place] [phone]   [facility] [address],  [place], [place] [phone]   [facility] - Intensive Outpatient [address] Laurell Josephs [address], [place], [place] [phone]   [facility] ([facility] [address] [place].,  [place], [place] [phone] or [phone]   [facility] ([facility]) [address] [place], [place] [phone] Accepts Medicaid  [facility] [address]  [place] [phone] Substance Abuse/Addiction Treatment   [facility] Organization         Address  Phone  Notes  [facility]  [phone]   Angie Fava, PhD [address] Ervin Knack [place], [place]   [phone] or [phone]   [facility] Behavioral   [address] [place], [place] [phone]   [facility] [address] [place], [place] [phone] Insurance/Medicaid/sponsorship through [employer] and Families [address]                                    [place], [place] [phone] Therapy/tele-psych/case  °[facility] [address]  ° [place], [place] [phone]    °Dr. Arfeen  [phone]   °[facility] [place]  [employer] [place] [facility] 1) [address], [place] °2) [address], [place] °3)  [address], [place] [phone] °([phone] ° °([phone]   °[place] Child Abuse Hotline (336) 342-1394 or (336) [phone] (After Hours)    ° ° ° °

## 2016-01-28 ENCOUNTER — Emergency Department (HOSPITAL_COMMUNITY)
Admission: EM | Admit: 2016-01-28 | Discharge: 2016-01-28 | Disposition: A | Discharge status: Home / Self Care | Payer: No Typology Code available for payment source | Attending: Emergency Medicine | Admitting: Emergency Medicine

## 2016-01-28 ENCOUNTER — Encounter (HOSPITAL_COMMUNITY): Payer: Self-pay | Admitting: *Deleted

## 2016-01-28 DIAGNOSIS — Z79899 Other long term (current) drug therapy: Secondary | ICD-10-CM | POA: Insufficient documentation

## 2016-01-28 DIAGNOSIS — K029 Dental caries, unspecified: Secondary | ICD-10-CM

## 2016-01-28 DIAGNOSIS — F319 Bipolar disorder, unspecified: Secondary | ICD-10-CM | POA: Insufficient documentation

## 2016-01-28 DIAGNOSIS — K0889 Other specified disorders of teeth and supporting structures: Secondary | ICD-10-CM | POA: Insufficient documentation

## 2016-01-28 DIAGNOSIS — F419 Anxiety disorder, unspecified: Secondary | ICD-10-CM | POA: Insufficient documentation

## 2016-01-28 DIAGNOSIS — F17210 Nicotine dependence, cigarettes, uncomplicated: Secondary | ICD-10-CM | POA: Insufficient documentation

## 2016-01-28 DIAGNOSIS — Z792 Long term (current) use of antibiotics: Secondary | ICD-10-CM | POA: Insufficient documentation

## 2016-01-28 DIAGNOSIS — Z880 Allergy status to penicillin: Secondary | ICD-10-CM | POA: Insufficient documentation

## 2016-01-28 MED ORDER — CLINDAMYCIN HCL 150 MG PO CAPS: 150.0000 mg | ORAL_CAPSULE | Freq: Four times a day (QID) | ORAL | Status: AC

## 2016-01-28 MED ORDER — IBUPROFEN 600 MG PO TABS: 600.0000 mg | ORAL_TABLET | Freq: Four times a day (QID) | ORAL | Status: AC | PRN

## 2016-01-28 NOTE — ED Provider Notes (Signed)
CSN: [id number]     Arrival date & time 01/28/16  1334 History  By signing my name below, I, Sonum Patel, attest that this documentation has been prepared under the direction and in the presence of Fayrene Helper, PA-C. Electronically Signed: Sonum Patel, Neurosurgeon. 01/28/2016. 2:24 PM.    No chief complaint on file.  The history is provided by the patient. No language interpreter was used.     HPI Comments: Scott Weaver is a 25 y.o. male who presents to the Emergency Department complaining of constant, gradually worsened right upper dental pain that began last night. He describes the pain as "having a hot rod on it" and states it is severe. He states breathing and chewing worsens the pain. He denies similar pain to the affected area in the past. He denies ear pain, fever.   Past Medical History  Diagnosis Date  . Bipolar 1 disorder   . Anxiety    No past surgical history on file. No family history on file. Social History  Substance Use Topics  . Smoking status: Current Every Day Smoker -- 0.50 packs/day    Types: Cigarettes  . Smokeless tobacco: Never Used  . Alcohol Use: No    Review of Systems  Constitutional: Negative for fever.  HENT: Positive for dental problem. Negative for ear pain.     Allergies  Tramadol and Penicillins  Home Medications   Prior to Admission medications   Medication Sig Start Date End Date Taking? Authorizing Provider  acetaminophen (TYLENOL) 500 MG tablet Take 1 tablet (500 mg total) by mouth every 6 (six) hours as needed. 05/21/15   Mady Gemma, PA-C  busPIRone (BUSPAR) 10 MG tablet Take 10 mg by mouth 2 (two) times daily.     Historical Provider, MD  cloNIDine (CATAPRES) 0.1 MG tablet Take 1 tablet (0.1 mg total) by mouth 2 (two) times daily in the am and at bedtime.. For 2 days then 1 tab daily for 2days then STOP 10/09/13   Zannie Cove, MD  divalproex (DEPAKOTE ER) 500 MG 24 hr tablet Take 500 mg by mouth 2 (two) times daily.      Historical Provider, MD  doxycycline (VIBRAMYCIN) 100 MG capsule Take 1 capsule (100 mg total) by mouth 2 (two) times daily. 05/21/15   Mady Gemma, PA-C  ibuprofen (ADVIL,MOTRIN) 600 MG tablet Take 1 tablet (600 mg total) by mouth every 6 (six) hours as needed. 03/23/15   Eyvonne Mechanic, PA-C  oxyCODONE-acetaminophen (PERCOCET) 5-325 MG per tablet Take 1 tablet by mouth every 6 (six) hours as needed for moderate pain. 03/04/15   Purvis Sheffield, MD  QUEtiapine (SEROQUEL XR) 300 MG 24 hr tablet Take 300 mg by mouth at bedtime.    Historical Provider, MD  terbinafine (LAMISIL) 250 MG tablet Take 1 tablet (250 mg total) by mouth daily. 12/20/14   Kaitlyn Szekalski, PA-C   BP 126/91 mmHg  Pulse 94  Temp(Src) 97.7 F (36.5 C) (Oral)  Resp 18  Ht  (1.702 m)  Wt 161 lb (73.029 kg)  BMI 25.21 kg/m2  SpO2 100% Physical Exam  Constitutional: He is oriented to person, place, and time. He appears well-developed and well-nourished.  HENT:  Head: Normocephalic and atraumatic.  Mouth/Throat: No trismus in the jaw. No dental abscesses.  Significant dental decay to tooth # 3, no gingival erythema, no abscess, no trismus  Cardiovascular: Normal rate.   Pulmonary/Chest: Effort normal.  Neurological: He is alert and oriented to person, place, and  time.  Skin: Skin is warm and dry.  Psychiatric: He has a normal mood and affect.  Nursing note and vitals reviewed.   ED Course  Procedures (including critical care time)  DIAGNOSTIC STUDIES: Oxygen Saturation is 100% on RA, normal by my interpretation.    COORDINATION OF CARE: 2:27 PM Will discharge home with antibiotics and anti-inflammatories. Discussed treatment plan with pt at bedside and pt agreed to plan.     MDM   Final diagnoses:  Pain due to dental caries   Patient with dentalgia.  No abscess requiring immediate incision and drainage.  Exam not concerning for Ludwig's angina or pharyngeal abscess.  Will treat with abx and  nsaids. Pt instructed to follow-up with dentist.  Discussed return precautions. Pt safe for discharge.   BP 126/91 mmHg  Pulse 94  Temp(Src) 97.7 F (36.5 C) (Oral)  Resp 18  Ht 5\' 7"  (1.702 m)  Wt 73.029 kg  BMI 25.21 kg/m2  SpO2 100%  I personally performed the services described in this documentation, which was scribed in my presence. The recorded information has been reviewed and is accurate.     Fayrene HelperBowie Shamaria Kavan, PA-C 01/28/16 1442  Alvira MondayErin Schlossman, MD 01/29/16 [phone]

## 2016-01-28 NOTE — ED Notes (Signed)
Declined W/C at D/C and was escorted to lobby by RN. 

## 2016-01-28 NOTE — Discharge Instructions (Signed)
Dental Pain °Dental pain may be caused by many things, including: °· Tooth decay (cavities or caries). Cavities cause the nerve of your tooth to be open to air and hot or cold temperatures. This can cause pain or discomfort. °· Abscess or infection. A dental abscess is an area that is full of infected pus from a bacterial infection in the inner part of the tooth (pulp). It usually happens at the end of the tooth's root. °· Injury. °· An unknown reason (idiopathic). °Your pain may be mild or severe. It may only happen when: °· You are chewing. °· You are exposed to hot or cold temperature. °· You are eating or drinking sugary foods or beverages, such as: °¨ Soda. °¨ Candy. °Your pain may also be there all of the time. °HOME CARE °Watch your dental pain for any changes. Do these things to lessen your discomfort: °· Take medicines only as told by your dentist. °· If your dentist tells you to take an antibiotic medicine, finish all of it even if you start to feel better. °· Keep all follow-up visits as told by your dentist. This is important. °· Do not apply heat to the outside of your face. °· Rinse your mouth or gargle with salt water if told by your dentist. This helps with pain and swelling. °¨ You can make salt water by adding ¼ tsp of salt to 1 cup of warm water. °· Apply ice to the painful area of your face: °¨ Put ice in a plastic bag. °¨ Place a towel between your skin and the bag. °¨ Leave the ice on for 20 minutes, 2-3 times per day. °· Avoid foods or drinks that cause you pain, such as: °¨ Very hot or very cold foods or drinks. °¨ Sweet or sugary foods or drinks. °GET HELP IF: °· Your pain is not helped with medicines. °· Your symptoms are worse. °· You have new symptoms. °GET HELP RIGHT AWAY IF: °· You cannot open your mouth. °· You are having trouble breathing or swallowing. °· You have a fever. °· Your face, neck, or jaw is puffy (swollen). °  °This information is not intended to replace advice given to  you by your health care provider. Make sure you discuss any questions you have with your health care provider. °  °Document Released: 02/25/2008 Document Revised: 01/23/2015 Document Reviewed: 09/04/2014 °[employer]vier Interactive Patient Education ©2016 [employer] ° °
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
## 2017-12-21 DEATH — deceased
